# Patient Record
Sex: Female | Born: 1990 | Race: White | Hispanic: No | Marital: Single | Smoking: Current every day smoker
Health system: Southern US, Community
[De-identification: ages and names within clinical notes are randomized; demographics above are authoritative.]

## PROBLEM LIST (undated history)

## (undated) DIAGNOSIS — Z950 Presence of cardiac pacemaker: Secondary | ICD-10-CM

## (undated) DIAGNOSIS — I495 Sick sinus syndrome: Secondary | ICD-10-CM

## (undated) DIAGNOSIS — R001 Bradycardia, unspecified: Secondary | ICD-10-CM

---

## 2017-11-09 ENCOUNTER — Emergency Department (HOSPITAL_COMMUNITY): Payer: Medicaid Other

## 2017-11-09 ENCOUNTER — Encounter (HOSPITAL_COMMUNITY): Payer: Self-pay | Admitting: Emergency Medicine

## 2017-11-09 ENCOUNTER — Emergency Department (HOSPITAL_COMMUNITY)
Admission: EM | Admit: 2017-11-09 | Discharge: 2017-11-10 | Disposition: A | Discharge status: Home / Self Care | Payer: Medicaid Other | Attending: Emergency Medicine | Admitting: Emergency Medicine

## 2017-11-09 DIAGNOSIS — Z20818 Contact with and (suspected) exposure to other bacterial communicable diseases: Secondary | ICD-10-CM | POA: Insufficient documentation

## 2017-11-09 DIAGNOSIS — F17210 Nicotine dependence, cigarettes, uncomplicated: Secondary | ICD-10-CM | POA: Insufficient documentation

## 2017-11-09 DIAGNOSIS — R0602 Shortness of breath: Secondary | ICD-10-CM | POA: Insufficient documentation

## 2017-11-09 DIAGNOSIS — Z950 Presence of cardiac pacemaker: Secondary | ICD-10-CM | POA: Insufficient documentation

## 2017-11-09 DIAGNOSIS — R002 Palpitations: Secondary | ICD-10-CM | POA: Insufficient documentation

## 2017-11-09 DIAGNOSIS — L03114 Cellulitis of left upper limb: Secondary | ICD-10-CM | POA: Insufficient documentation

## 2017-11-09 DIAGNOSIS — R0789 Other chest pain: Secondary | ICD-10-CM | POA: Insufficient documentation

## 2017-11-09 HISTORY — DX: Presence of cardiac pacemaker: Z95.0

## 2017-11-09 HISTORY — DX: Sick sinus syndrome: I49.5

## 2017-11-09 HISTORY — DX: Bradycardia, unspecified: R00.1

## 2017-11-09 LAB — BASIC METABOLIC PANEL
ANION GAP: 9 (ref 5–15)
BUN: 12 mg/dL (ref 6–20)
CO2: 24 mmol/L (ref 22–32)
Calcium: 8.9 mg/dL (ref 8.9–10.3)
Chloride: 105 mmol/L (ref 101–111)
Creatinine, Ser: 0.48 mg/dL (ref 0.44–1.00)
GFR calc Af Amer: >60 mL/min (ref >60)
GFR calc non Af Amer: >60 mL/min (ref >60)
GLUCOSE: 110 mg/dL — AB (ref 65–99)
POTASSIUM: 3.7 mmol/L (ref 3.5–5.1)
SODIUM: 138 mmol/L (ref 135–145)

## 2017-11-09 LAB — CBC
HEMATOCRIT: 30.9 % — AB (ref 36.0–46.0)
HEMOGLOBIN: 9.9 g/dL — AB (ref 12.0–15.0)
MCH: 26.2 pg (ref 26.0–34.0)
MCHC: 32 g/dL (ref 30.0–36.0)
MCV: 81.7 fL (ref 78.0–100.0)
Platelets: 156 10*3/uL (ref 150–400)
RBC: 3.78 MIL/uL — ABNORMAL LOW (ref 3.87–5.11)
RDW: 14.8 % (ref 11.5–15.5)
WBC: 7.2 10*3/uL (ref 4.0–10.5)

## 2017-11-09 LAB — I-STAT TROPONIN, ED: Troponin i, poc: 0.01 ng/mL (ref 0.00–0.08)

## 2017-11-09 LAB — I-STAT BETA HCG BLOOD, ED (MC, WL, AP ONLY)

## 2017-11-09 LAB — PROTIME-INR
INR: 1.22
PROTHROMBIN TIME: 15.3 s — AB (ref 11.4–15.2)

## 2017-11-09 NOTE — ED Triage Notes (Signed)
Patient reports left lower chest pain with nausea , denies SOB or diaphoresis , her cardiologist is Dr. Sula Rumpleruker , history of sick sinus syndrome/bradycardia with pacemaker .

## 2017-11-09 NOTE — ED Notes (Signed)
Pt rounded on by RN. Pt ambulatory and trying to get drink from machine.

## 2017-11-10 LAB — MAGNESIUM: MAGNESIUM: 2.3 mg/dL (ref 1.7–2.4)

## 2017-11-10 LAB — I-STAT TROPONIN, ED: Troponin i, poc: 0 ng/mL (ref 0.00–0.08)

## 2017-11-10 MED ORDER — SULFAMETHOXAZOLE-TRIMETHOPRIM 800-160 MG PO TABS
1.0000 | ORAL_TABLET | Freq: Once | ORAL | Status: AC
  Administered 2017-11-10: 1 via ORAL
  Filled 2017-11-10: qty 1

## 2017-11-10 MED ORDER — CEPHALEXIN 500 MG PO CAPS: 500.0000 mg | ORAL_CAPSULE | Freq: Three times a day (TID) | ORAL | 0 refills | Status: AC

## 2017-11-10 MED ORDER — SODIUM CHLORIDE 0.9 % IV BOLUS
500.0000 mL | Freq: Once | INTRAVENOUS | Status: AC
  Administered 2017-11-10: 500 mL via INTRAVENOUS

## 2017-11-10 MED ORDER — SULFAMETHOXAZOLE-TRIMETHOPRIM 800-160 MG PO TABS: 1.0000 | ORAL_TABLET | Freq: Two times a day (BID) | ORAL | 0 refills | Status: AC

## 2017-11-10 NOTE — Discharge Instructions (Addendum)
Start taking the antibiotics as prescribed. The BACTRIM will cover MRSA or staph, but the Keflex will help cover other organisms. I'd recommend starting both, but if you would like to take one medication only, stick with the Bactrim.  For your chest pain, I suspect this is from dehydration and your underlying heart rhythm problems. The interrogation of your pacemaker was reassuring today, but did show the episodes of VTach that you discussed with your Cardiologist.

## 2017-11-10 NOTE — ED Notes (Signed)
Pt sees Dr. Mayme Gentarucker in [place]. Pt's pacemaker interrogated.

## 2017-11-10 NOTE — ED Provider Notes (Signed)
[facility] EMERGENCY DEPARTMENT Provider Note   CSN: [id number] Arrival date & time: 11/09/17  2036     History   Chief Complaint Chief Complaint  Patient presents with  . Chest Pain    HPI Kelsey Hardy is a 27 y.o. female.  HPI   27 year old female with past medical history as below including sick sinus syndrome status post pacemaker placement here with transient chest pain as well as left thumb redness.  Regarding her thumb redness, the patient states that approximately 3 days ago, she began to develop an aching, throbbing pain in her left dorsum of the hand overlying her thumb.  She states she uses her hands frequently at work.  She is currently been helping to take care of her significant other, who has a staph infection.  She began developing redness in this area over the last 24 hours.  She states that possibly due to the anxiety associated with this, while she was with her boyfriend today she began to develop a mild chest pressure and shortness of breath.  These symptoms were associated with mild palpitations.  She has a history of similar symptoms in the past and has had a pacemaker but has no known coronary disease.  Symptoms are now completely resolved.  No lower extremity swelling.  No history of DVT or PE.  She is not on blood thinners.  She does note that she was recently seen by her cardiologist on Friday for tachycardia and was told she had several episodes of ventricular tachycardia.  She is currently being worked up for this as an outpatient.  No other medication changes.  Past Medical History:  Diagnosis Date  . Bradycardia   . Pacemaker   . Sick sinus syndrome (HCC)     There are no active problems to display for this patient.   Past Surgical History:  Procedure Laterality Date  . CESAREAN SECTION       OB History   None      Home Medications    Prior to Admission medications   Medication Sig Start Date End Date Taking? Authorizing  Provider  cephALEXin (KEFLEX) 500 MG capsule Take 1 capsule (500 mg total) by mouth 3 (three) times daily for 7 days. 11/10/17 11/17/17  Shaune Pollack, MD  sulfamethoxazole-trimethoprim (BACTRIM DS,SEPTRA DS) 800-160 MG tablet Take 1 tablet by mouth 2 (two) times daily for 7 days. 11/10/17 11/17/17  Shaune Pollack, MD    Family History No family history on file.  Social History Social History   Tobacco Use  . Smoking status: Current Every Day Smoker  . Smokeless tobacco: Never Used  Substance Use Topics  . Alcohol use: Never    Frequency: Never  . Drug use: Never     Allergies   Patient has no known allergies.   Review of Systems Review of Systems  Constitutional: Positive for fatigue. Negative for chills and fever.  HENT: Negative for congestion and rhinorrhea.   Eyes: Negative for visual disturbance.  Respiratory: Positive for chest tightness. Negative for cough, shortness of breath and wheezing.   Cardiovascular: Positive for chest pain. Negative for leg swelling.  Gastrointestinal: Negative for abdominal pain, diarrhea, nausea and vomiting.  Genitourinary: Negative for dysuria and flank pain.  Musculoskeletal: Negative for neck pain and neck stiffness.  Skin: Positive for rash. Negative for wound.  Allergic/Immunologic: Negative for immunocompromised state.  Neurological: Negative for syncope, weakness and headaches.  All other systems reviewed and are negative.  Physical Exam Updated Vital Signs BP 117/79   Pulse (!) 51   Temp 98.3 F (36.8 C) (Oral)   Resp (!) 24   Ht 5\' 1"  (1.549 m)   Wt 55.3 kg (122 lb)   LMP 11/06/2017 Comment: pt shielded  SpO2 96%   BMI 23.05 kg/m   Physical Exam  Constitutional: She is oriented to person, place, and time. She appears well-developed and well-nourished. No distress.  HENT:  Head: Normocephalic and atraumatic.  Eyes: Conjunctivae are normal.  Neck: Neck supple.  Cardiovascular: Normal rate, regular rhythm and  normal heart sounds. Exam reveals no friction rub.  No murmur heard. Pulmonary/Chest: Effort normal and breath sounds normal. No respiratory distress. She has no wheezes. She has no rales.  Abdominal: She exhibits no distension.  Musculoskeletal: She exhibits no edema.  Neurological: She is alert and oriented to person, place, and time. She exhibits normal muscle tone.  Skin: Skin is warm. Capillary refill takes less than 2 seconds.  Psychiatric: She has a normal mood and affect.  Nursing note and vitals reviewed.  UPPER EXTREMITY EXAM: LEFT  INSPECTION & PALPATION: Mild erythema overlying dorsum of left thumb, with slight streaking.  No fluctuance.  No drainage.  No open wounds.  No track marks or signs of skin trauma.  SENSORY: Sensation is intact to light touch in:  Superficial radial nerve distribution (dorsal first web space) Median nerve distribution (tip of index finger)   Ulnar nerve distribution (tip of small finger)     MOTOR:  + Motor posterior interosseous nerve (thumb IP extension) + Anterior interosseous nerve (thumb IP flexion, index finger DIP flexion) + Radial nerve (wrist extension) + Median nerve (palpable firing thenar mass) + Ulnar nerve (palpable firing of first dorsal interosseous muscle)  VASCULAR: 2+ radial pulse Brisk capillary refill < 2 sec, fingers warm and well-perfused   ED Treatments / Results  Labs (all labs ordered are listed, but only abnormal results are displayed) Labs Reviewed  BASIC METABOLIC PANEL - Abnormal; Notable for the following components:      Result Value   Glucose, Bld 110 (*)    All other components within normal limits  CBC - Abnormal; Notable for the following components:   RBC 3.78 (*)    Hemoglobin 9.9 (*)    HCT 30.9 (*)    All other components within normal limits  PROTIME-INR - Abnormal; Notable for the following components:   Prothrombin Time 15.3 (*)    All other components within normal limits  MAGNESIUM    I-STAT TROPONIN, ED  I-STAT BETA HCG BLOOD, ED ([facility], [facility], [facility] ONLY)  I-STAT TROPONIN, ED    EKG EKG Interpretation  Date/Time:  Friday November 09 2017 20:41:02 EDT Ventricular Rate:  73 PR Interval:  248 QRS Duration: 82 QT Interval:  394 QTC Calculation: 434 R Axis:   57 Text Interpretation:  Sinus rhythm with sinus arrhythmia with 1st degree A-V block with frequent Premature ventricular complexes Abnormal ECG No old tracing to compare Confirmed by Shaune Pollack [phone]) on 11/10/2017 5:14:21 AM   Radiology Dg Chest 2 View  Result Date: 11/09/2017 CLINICAL DATA:  Left lower chest pain with nausea. History of sick sinus syndrome/bradycardia with pacemaker. EXAM: CHEST - 2 VIEW COMPARISON:  None. FINDINGS: Moderate cardiomegaly. There is a single lead left subclavian pacemaker projecting to the right ventricular apex. There is mild linear scarring or atelectasis at the right lung base. The lungs are otherwise clear. There is no pleural effusion  or pneumothorax. No acute osseous findings are seen. IMPRESSION: Cardiomegaly without acute cardiopulmonary process. Pacemaker appears well positioned. Electronically Signed   By: Carey BullocksWilliam  Veazey M.D.   On: 11/09/2017 21:12    Procedures Procedures (including critical care time)  Medications Ordered in ED Medications  sodium chloride 0.9 % bolus 500 mL (0 mLs Intravenous Stopped 11/10/17 0536)  sulfamethoxazole-trimethoprim (BACTRIM DS,SEPTRA DS) 800-160 MG per tablet 1 tablet (1 tablet Oral Given 11/10/17 0435)     Initial Impression / Assessment and Plan / ED Course  I have reviewed the triage vital signs and the nursing notes.  Pertinent labs & imaging results that were available during my care of the patient were reviewed by me and considered in my medical decision making (see chart for details).     27 yo F here with multiple complaints.  Thumb pain/redness: Exam is consistent with superficial cellulitis.  No evidence of abscess.  No  extension to the flexor surface or signs of tenosynovitis.  She is afebrile and hemodynamically stable.  No evidence of sepsis.  Will give her Bactrim given her history of recent MRSA exposure, as well as Keflex and DC home.  Regarding her chest pain, her pacemaker was interrogated.  She did have some briefly resolved episodes of V. tach previously, but last episode was on 6/5, after which she was seen by her cardiologist.  Her lytes are acceptable.  Troponin negative x2.  She has no hypoxia, tachypnea, shortness of breath currently, tachycardia, or signs to suggest DVT or PE and she is PERC negative.  Will refer her back to her cardiologist with good return precautions.  Final Clinical Impressions(s) / ED Diagnoses   Final diagnoses:  Atypical chest pain  Cellulitis of left upper extremity    ED Discharge Orders        Ordered    cephALEXin (KEFLEX) 500 MG capsule  3 times daily     11/10/17 0633    sulfamethoxazole-trimethoprim (BACTRIM DS,SEPTRA DS) 800-160 MG tablet  2 times daily     11/10/17 [id number]       Shaune PollackIsaacs, Damari Suastegui, MD 11/10/17 [phone]

## 2017-11-15 ENCOUNTER — Other Ambulatory Visit: Payer: Self-pay

## 2017-11-15 ENCOUNTER — Emergency Department (HOSPITAL_COMMUNITY): Payer: Medicaid Other

## 2017-11-15 ENCOUNTER — Encounter (HOSPITAL_COMMUNITY): Payer: Self-pay

## 2017-11-15 ENCOUNTER — Emergency Department (HOSPITAL_COMMUNITY)
Admission: EM | Admit: 2017-11-15 | Discharge: 2017-11-15 | Disposition: A | Discharge status: Home / Self Care | Payer: Medicaid Other | Attending: Emergency Medicine | Admitting: Emergency Medicine

## 2017-11-15 DIAGNOSIS — Z950 Presence of cardiac pacemaker: Secondary | ICD-10-CM | POA: Diagnosis not present

## 2017-11-15 DIAGNOSIS — Z79899 Other long term (current) drug therapy: Secondary | ICD-10-CM | POA: Insufficient documentation

## 2017-11-15 DIAGNOSIS — R110 Nausea: Secondary | ICD-10-CM

## 2017-11-15 DIAGNOSIS — F17200 Nicotine dependence, unspecified, uncomplicated: Secondary | ICD-10-CM | POA: Insufficient documentation

## 2017-11-15 DIAGNOSIS — R1011 Right upper quadrant pain: Secondary | ICD-10-CM

## 2017-11-15 DIAGNOSIS — R197 Diarrhea, unspecified: Secondary | ICD-10-CM | POA: Insufficient documentation

## 2017-11-15 DIAGNOSIS — N3000 Acute cystitis without hematuria: Secondary | ICD-10-CM | POA: Diagnosis not present

## 2017-11-15 LAB — COMPREHENSIVE METABOLIC PANEL
ALBUMIN: 3.3 g/dL — AB (ref 3.5–5.0)
ALT: 48 U/L (ref 14–54)
ANION GAP: 7 (ref 5–15)
AST: 48 U/L — ABNORMAL HIGH (ref 15–41)
Alkaline Phosphatase: 140 U/L — ABNORMAL HIGH (ref 38–126)
BILIRUBIN TOTAL: 0.6 mg/dL (ref 0.3–1.2)
BUN: 12 mg/dL (ref 6–20)
CHLORIDE: 111 mmol/L (ref 101–111)
CO2: 26 mmol/L (ref 22–32)
Calcium: 9 mg/dL (ref 8.9–10.3)
Creatinine, Ser: 0.61 mg/dL (ref 0.44–1.00)
GFR calc Af Amer: >60 mL/min (ref >60)
GLUCOSE: 113 mg/dL — AB (ref 65–99)
POTASSIUM: 3.7 mmol/L (ref 3.5–5.1)
Sodium: 144 mmol/L (ref 135–145)
TOTAL PROTEIN: 6.8 g/dL (ref 6.5–8.1)

## 2017-11-15 LAB — URINALYSIS, ROUTINE W REFLEX MICROSCOPIC
BILIRUBIN URINE: NEGATIVE
Glucose, UA: 50 mg/dL — AB
HGB URINE DIPSTICK: NEGATIVE
Ketones, ur: NEGATIVE mg/dL
LEUKOCYTES UA: NEGATIVE
NITRITE: NEGATIVE
Specific Gravity, Urine: 1.011 (ref 1.005–1.030)
pH: 9 — ABNORMAL HIGH (ref 5.0–8.0)

## 2017-11-15 LAB — CBC WITH DIFFERENTIAL/PLATELET
BASOS ABS: 0 10*3/uL (ref 0.0–0.1)
BASOS PCT: 0 %
Eosinophils Absolute: 0.2 10*3/uL (ref 0.0–0.7)
Eosinophils Relative: 2 %
HEMATOCRIT: 31.9 % — AB (ref 36.0–46.0)
HEMOGLOBIN: 10.5 g/dL — AB (ref 12.0–15.0)
Lymphocytes Relative: 12 %
Lymphs Abs: 1.1 10*3/uL (ref 0.7–4.0)
MCH: 26.9 pg (ref 26.0–34.0)
MCHC: 32.9 g/dL (ref 30.0–36.0)
MCV: 81.6 fL (ref 78.0–100.0)
Monocytes Absolute: 0.8 10*3/uL (ref 0.1–1.0)
Monocytes Relative: 8 %
NEUTROS ABS: 7.2 10*3/uL (ref 1.7–7.7)
NEUTROS PCT: 78 %
Platelets: 214 10*3/uL (ref 150–400)
RBC: 3.91 MIL/uL (ref 3.87–5.11)
RDW: 15.3 % (ref 11.5–15.5)
WBC: 9.2 10*3/uL (ref 4.0–10.5)

## 2017-11-15 LAB — RAPID URINE DRUG SCREEN, HOSP PERFORMED
Amphetamines: NOT DETECTED
Barbiturates: NOT DETECTED
Benzodiazepines: NOT DETECTED
Cocaine: NOT DETECTED
OPIATES: POSITIVE — AB
TETRAHYDROCANNABINOL: NOT DETECTED

## 2017-11-15 LAB — PREGNANCY, URINE: Preg Test, Ur: NEGATIVE

## 2017-11-15 LAB — LIPASE, BLOOD: LIPASE: 46 U/L (ref 11–51)

## 2017-11-15 MED ORDER — AZITHROMYCIN 250 MG PO TABS: 250.0000 mg | ORAL_TABLET | Freq: Every day | ORAL | 0 refills | Status: AC

## 2017-11-15 MED ORDER — ONDANSETRON HCL 4 MG/2ML IJ SOLN
4.0000 mg | Freq: Once | INTRAMUSCULAR | Status: AC
  Administered 2017-11-15: 4 mg via INTRAVENOUS
  Filled 2017-11-15: qty 2

## 2017-11-15 MED ORDER — ONDANSETRON HCL 4 MG/2ML IJ SOLN
4.0000 mg | Freq: Once | INTRAMUSCULAR | Status: AC
  Administered 2017-11-15: 4 mg via INTRAVENOUS

## 2017-11-15 MED ORDER — IOPAMIDOL (ISOVUE-300) INJECTION 61%
100.0000 mL | Freq: Once | INTRAVENOUS | Status: AC | PRN
  Administered 2017-11-15: 100 mL via INTRAVENOUS

## 2017-11-15 MED ORDER — SODIUM CHLORIDE 0.9 % IV BOLUS
500.0000 mL | Freq: Once | INTRAVENOUS | Status: AC
  Administered 2017-11-15: 500 mL via INTRAVENOUS

## 2017-11-15 MED ORDER — PROMETHAZINE HCL 25 MG PO TABS: 25.0000 mg | ORAL_TABLET | Freq: Four times a day (QID) | ORAL | 0 refills | Status: AC | PRN

## 2017-11-15 MED ORDER — TECHNETIUM TC 99M MEBROFENIN IV KIT
5.0000 | PACK | Freq: Once | INTRAVENOUS | Status: AC | PRN
  Administered 2017-11-15: 5.17 via INTRAVENOUS

## 2017-11-15 MED ORDER — SODIUM CHLORIDE 0.9 % IV SOLN
500.0000 mg | Freq: Once | INTRAVENOUS | Status: AC
  Administered 2017-11-15: 500 mg via INTRAVENOUS
  Filled 2017-11-15: qty 500

## 2017-11-15 MED ORDER — AMOXICILLIN 500 MG PO CAPS: 500.0000 mg | ORAL_CAPSULE | Freq: Three times a day (TID) | ORAL | 0 refills | Status: AC

## 2017-11-15 MED ORDER — SODIUM CHLORIDE 0.9 % IV SOLN
1.0000 g | Freq: Once | INTRAVENOUS | Status: AC
  Administered 2017-11-15: 1 g via INTRAVENOUS

## 2017-11-15 MED ORDER — ONDANSETRON HCL 4 MG/2ML IJ SOLN
INTRAMUSCULAR | Status: AC
  Filled 2017-11-15: qty 2

## 2017-11-15 MED ORDER — IOPAMIDOL (ISOVUE-300) INJECTION 61%
30.0000 mL | Freq: Once | INTRAVENOUS | Status: AC | PRN
  Administered 2017-11-15: 30 mL via ORAL

## 2017-11-15 MED ORDER — KETOROLAC TROMETHAMINE 30 MG/ML IJ SOLN
30.0000 mg | Freq: Once | INTRAMUSCULAR | Status: AC
  Administered 2017-11-15: 30 mg via INTRAVENOUS
  Filled 2017-11-15: qty 1

## 2017-11-15 MED ORDER — SODIUM CHLORIDE 0.9 % IV SOLN
1.0000 g | Freq: Once | INTRAVENOUS | Status: AC
  Administered 2017-11-15: 1 g via INTRAVENOUS
  Filled 2017-11-15: qty 10

## 2017-11-15 MED ORDER — SODIUM CHLORIDE 0.9 % IV BOLUS
1000.0000 mL | Freq: Once | INTRAVENOUS | Status: AC
Start: 2017-11-15 — End: 2017-11-15
  Administered 2017-11-15: 1000 mL via INTRAVENOUS

## 2017-11-15 MED ORDER — CEFTRIAXONE SODIUM 1 G IJ SOLR
INTRAMUSCULAR | Status: AC
  Filled 2017-11-15: qty 10

## 2017-11-15 MED ORDER — LORAZEPAM 2 MG/ML IJ SOLN
1.0000 mg | Freq: Once | INTRAMUSCULAR | Status: AC
Start: 2017-11-15 — End: 2017-11-15
  Administered 2017-11-15: 1 mg via INTRAVENOUS
  Filled 2017-11-15: qty 1

## 2017-11-15 NOTE — ED Notes (Signed)
NPO since 11/14/2017 at 1800.  No opiates taken.

## 2017-11-15 NOTE — Consult Note (Signed)
[facility] Consult  Reason for Consult:?: Acalculous Cholecystitis  Referring Physician: Dr. Wilson Singer  Chief Complaint    Abdominal Pain      Kelsey Hardy is a 27 y.o. female.  HPI: Kelsey Hardy is a 27 yo with heroin abuse, active, on suboxone too, who recently was treated for bacteremia in 05/2017 at [facility], and found to have bradycardia, sick sinus syndrome, and underwent pacemaker placement 07/2017. She came to the Ed with RUQ pain, and was found to have severe liver congestion and some fluid around the gallbladder on CT scan. She underwent a [place] that also demonstrated no stones and hepatic congestion and pericholecystic fluid.    She had been having the pain since the evening prior, and was also having some withdrawal symptoms per ED documentation.  She had some nausea but no vomiting.   She had some cough and her symptoms of dizziness prior to her pacemaker were better.  She denies any endocarditis or heart infection, but did say she only had 1 pacemaker lead due to scarring of her heart. Her heart was very enlarged on the CT scan.   Past Medical History:  Diagnosis Date  . Bradycardia   . Pacemaker   . Sick sinus syndrome [facility])     Past Surgical History:  Procedure Laterality Date  . CESAREAN SECTION      History reviewed. No pertinent family history.  Social History   Tobacco Use  . Smoking status: Current Every Day Smoker  . Smokeless tobacco: Never Used  Substance Use Topics  . Alcohol use: Never    Frequency: Never  . Drug use: Never    Medications: I have reviewed the patient's current medications. No current facility-administered medications for this encounter.    Current Outpatient Medications  Medication Sig Dispense Refill Last Dose  . gabapentin (NEURONTIN) 300 MG capsule Take 4 capsules by mouth at bedtime.   11/14/2017 at Unknown time  . QUEtiapine (SEROQUEL) 25 MG tablet Take 1 tablet by mouth 4 (four) times daily.  5 11/14/2017 at  Unknown time  . sertraline (ZOLOFT) 100 MG tablet Take 1 tablet by mouth at bedtime.  5 11/14/2017 at Unknown time  . SUBOXONE 8-2 MG FILM Take 1 Film by mouth 3 (three) times daily.  0 11/14/2017 at Unknown time  . traZODone (DESYREL) 50 MG tablet Take 1 tablet by mouth at bedtime.   11/14/2017 at Unknown time  . amoxicillin (AMOXIL) 500 MG capsule Take 1 capsule (500 mg total) by mouth 3 (three) times daily. 21 capsule 0   . azithromycin (ZITHROMAX) 250 MG tablet Take 1 tablet (250 mg total) by mouth daily. Had first dose in ER. 1 tab daily starting 11/16/17. 4 tablet 0   . cephALEXin (KEFLEX) 500 MG capsule Take 1 capsule (500 mg total) by mouth 3 (three) times daily for 7 days. 21 capsule 0   . promethazine (PHENERGAN) 25 MG tablet Take 1 tablet (25 mg total) by mouth every 6 (six) hours as needed for nausea or vomiting. 15 tablet 0   . sulfamethoxazole-trimethoprim (BACTRIM DS,SEPTRA DS) 800-160 MG tablet Take 1 tablet by mouth 2 (two) times daily for 7 days. 14 tablet 0    No Known Allergies   ROS:  A comprehensive review of systems was negative except for: Constitutional: positive for shaking, ? withdrawal symptoms Cardiovascular: positive for bradycardia, pacemaker Gastrointestinal: positive for abdominal pain, diarrhea and nausea  Blood pressure 125/81, pulse (!) 51, temperature 98.3 F (36.8 C), temperature  source Oral, resp. rate 18, height _0  (1.549 m), weight 120 lb (54.4 kg), last menstrual period 11/06/2017, SpO2 94 %. Physical Exam  Constitutional: She is oriented to person, place, and time. She appears well-developed.  Non-toxic appearance.  HENT:  Head: Normocephalic.  Eyes: Pupils are equal, round, and reactive to light.  Dilated pupils  Cardiovascular: Normal rate and regular rhythm.  Pulmonary/Chest: Effort normal.  Abdominal: Normal appearance. There is hepatomegaly. There is tenderness in the right upper quadrant and epigastric area. There is no rebound and no  guarding.  Musculoskeletal: Normal range of motion.  No lower edema  Neurological: She is alert and oriented to person, place, and time.  Skin: Skin is warm and dry.  Psychiatric: She has a normal mood and affect. Her behavior is normal.  Vitals reviewed.   Results: Results for orders placed or performed during the hospital encounter of 11/15/17 (from the past 48 hour(s))  Urinalysis, Routine w reflex microscopic     Status: Abnormal   Collection Time: 11/15/17  1:18 AM  Result Value Ref Range   Color, Urine YELLOW YELLOW   APPearance CLOUDY (A) CLEAR   Specific Gravity, Urine 1.011 1.005 - 1.030   pH 9.0 (H) 5.0 - 8.0   Glucose, UA 50 (A) NEGATIVE mg/dL   Hgb urine dipstick NEGATIVE NEGATIVE   Bilirubin Urine NEGATIVE NEGATIVE   Ketones, ur NEGATIVE NEGATIVE mg/dL   Protein, ur >=300 (A) NEGATIVE mg/dL   Nitrite NEGATIVE NEGATIVE   Leukocytes, UA NEGATIVE NEGATIVE   RBC / HPF 11-20 0 - 5 RBC/hpf   WBC, UA 21-50 0 - 5 WBC/hpf   Bacteria, UA RARE (A) NONE SEEN   Squamous Epithelial / LPF 6-10 0 - 5   Mucus PRESENT     Comment: Performed at [facility], [address], [place], [place] [address]  Urine rapid drug screen (hosp performed)     Status: Abnormal   Collection Time: 11/15/17  1:18 AM  Result Value Ref Range   Opiates POSITIVE (A) NONE DETECTED   Cocaine NONE DETECTED NONE DETECTED   Benzodiazepines NONE DETECTED NONE DETECTED   Amphetamines NONE DETECTED NONE DETECTED   Tetrahydrocannabinol NONE DETECTED NONE DETECTED   Barbiturates NONE DETECTED NONE DETECTED    Comment: (NOTE) DRUG SCREEN FOR MEDICAL PURPOSES ONLY.  IF CONFIRMATION IS NEEDED FOR ANY PURPOSE, NOTIFY LAB WITHIN 5 DAYS. LOWEST DETECTABLE LIMITS FOR URINE DRUG SCREEN Drug Class                     Cutoff (ng/mL) Amphetamine and metabolites    1000 Barbiturate and metabolites    200 Benzodiazepine                 998 Tricyclics and metabolites     300 Opiates and metabolites         300 Cocaine and metabolites        300 THC                            50 Performed at [facility] [place], [place] [address]   Comprehensive metabolic panel     Status: Abnormal   Collection Time: 11/15/17  1:32 AM  Result Value Ref Range   Sodium 144 135 - 145 mmol/L   Potassium 3.7 3.5 - 5.1 mmol/L   Chloride 111 101 - 111 mmol/L   CO2 26 22 - 32 mmol/L  Glucose, Bld 113 (H) 65 - 99 mg/dL   BUN 12 6 - 20 mg/dL   Creatinine, Ser 0.61 0.44 - 1.00 mg/dL   Calcium 9.0 8.9 - 10.3 mg/dL   Total Protein 6.8 6.5 - 8.1 g/dL   Albumin 3.3 (L) 3.5 - 5.0 g/dL   AST 48 (H) 15 - 41 U/L   ALT 48 14 - 54 U/L   Alkaline Phosphatase 140 (H) 38 - 126 U/L   Total Bilirubin 0.6 0.3 - 1.2 mg/dL   GFR calc non Af Amer >60 >60 mL/min   GFR calc Af Amer >60 >60 mL/min    Comment: (NOTE) The eGFR has been calculated using the CKD EPI equation. This calculation has not been validated in all clinical situations. eGFR's persistently <60 mL/min signify possible Chronic Kidney Disease.    Anion gap 7 5 - 15    Comment: Performed at [facility], [address] [place], [place] [address]  Lipase, blood     Status: None   Collection Time: 11/15/17  1:32 AM  Result Value Ref Range   Lipase 46 11 - 51 U/L    Comment: Performed at [facility], [address] [place], [place] [address]  CBC with Differential     Status: Abnormal   Collection Time: 11/15/17  1:32 AM  Result Value Ref Range   WBC 9.2 4.0 - 10.5 K/uL   RBC 3.91 3.87 - 5.11 MIL/uL   Hemoglobin 10.5 (L) 12.0 - 15.0 g/dL   HCT 31.9 (L) 36.0 - 46.0 %   MCV 81.6 78.0 - 100.0 fL   MCH 26.9 26.0 - 34.0 pg   MCHC 32.9 30.0 - 36.0 g/dL   RDW 15.3 11.5 - 15.5 %   Platelets 214 150 - 400 K/uL   Neutrophils Relative % 78 %   Neutro Abs 7.2 1.7 - 7.7 K/uL   Lymphocytes Relative 12 %   Lymphs Abs 1.1 0.7 - 4.0 K/uL   Monocytes Relative 8 %   Monocytes Absolute 0.8 0.1 - 1.0 K/uL   Eosinophils Relative 2 %   Eosinophils  Absolute 0.2 0.0 - 0.7 K/uL   Basophils Relative 0 %   Basophils Absolute 0.0 0.0 - 0.1 K/uL    Comment: Performed at [facility], [address] [place], [place] [address]  Pregnancy, urine     Status: None   Collection Time: 11/15/17  1:32 AM  Result Value Ref Range   Preg Test, Ur NEGATIVE NEGATIVE    Comment:        THE SENSITIVITY OF THIS METHODOLOGY IS >20 mIU/mL. Performed at [facility], [address], [place], [place] [address]    Personally reviewed- HIDA negative and CT with congestion of the liver and around the gallbladder, no signs of stones Dg Chest 2 View  Result Date: 11/15/2017 CLINICAL DATA:  Cough EXAM: CHEST - 2 VIEW COMPARISON:  11/09/2017 FINDINGS: Left pacer remains in place, unchanged. Cardiomegaly. Patchy bilateral lower lobe airspace opacities. No effusions or pneumothorax. No acute bony abnormality. IMPRESSION: Stable cardiomegaly. Patchy bilateral lower lobe airspace opacities concerning for pneumonia. Electronically Signed   By: Rolm Baptise M.D.   On: 11/15/2017 07:37   Ct Abdomen Pelvis W Contrast  Result Date: 11/15/2017 CLINICAL DATA:  Abdominal pain. Right upper quadrant tenderness. UTI. Heroin abuse. EXAM: CT ABDOMEN AND PELVIS WITH CONTRAST TECHNIQUE: Multidetector CT imaging of the abdomen and pelvis was performed using the standard protocol following bolus administration of intravenous contrast. CONTRAST:  150m ISOVUE-300 IOPAMIDOL (ISOVUE-300) INJECTION 61%, 366mISOVUE-300 IOPAMIDOL (ISOVUE-300) INJECTION 61% COMPARISON:  Chest radiograph 11/09/2017 FINDINGS: Lower chest: Multi chamber cardiomegaly with primary right heart dilatation. Pacemaker partially included. Small bilateral pleural effusions and adjacent atelectasis. Hepatobiliary: The liver is enlarged with diffusely decreased hepatic density. Focal fatty sparing adjacent the gallbladder fossa. Mild periportal edema. Gallbladder is physiologically distended with diffuse gallbladder wall  thickening in small amount of pericholecystic fluid. No calcified stone. Pancreas: No ductal dilatation or inflammation. Spleen: Enlarged spanning 16.7 cm cranial caudal. Normal heterogeneity for phase of enhancement. Adrenals/Urinary Tract: Normal adrenal glands. No hydronephrosis or perinephric edema. Homogeneous renal enhancement. Small subcentimeter simple cysts in the left upper kidney. Urinary bladder is physiologically distended without wall thickening. No definite perivesicular stranding. Stomach/Bowel: Stomach distended with ingested contents and enteric contrast no small bowel dilatation, obstruction or inflammation. Normal appendix. Moderate stool in the proximal colon. Descending and sigmoid colon are minimally distended with equivocal wall thickening versus nondistention. Vascular/Lymphatic: Distended supra hepatic IVC. Normal caliber abdominal aorta. Prominent periuterine and adnexal vascularity. No enlarged abdominal or pelvic lymph nodes. Reproductive: Prominent periuterine and adnexal vascularity, left greater than right. Small cysts or follicles in the left ovary. Right ovary appears normal. Other: Small volume of simple free fluid in the abdomen and pelvis. No free air. No loculated abscess. Musculoskeletal: There are no acute or suspicious osseous abnormalities. IMPRESSION: 1. Diffuse gallbladder wall thickening is likely multifactorial but felt to be secondary to passive hepatic congestion or chronic liver disease. 2. Hepatosplenomegaly and hepatic steatosis. Suspect passive hepatic congestion with prominent right heart dilatation. 3. Left colonic wall thickening versus nondistention. 4. Prominent adnexal and periuterine vascularity as can be seen with pelvic congestion. 5. Cardiomegaly with predominant right heart dilatation. Small bilateral pleural effusions and small amount of ascites in the abdomen and pelvis. Electronically Signed   By: MeJeb Levering.D.   On: 11/15/2017 06:21   Nm  Hepato W/eject Fract  Result Date: 11/15/2017 CLINICAL DATA:  Chronic right upper quadrant pain for the past few months. EXAM: NUCLEAR MEDICINE HEPATOBILIARY IMAGING WITH GALLBLADDER EF TECHNIQUE: Sequential images of the abdomen were obtained out to 60 minutes following intravenous administration of radiopharmaceutical. After oral ingestion of Ensure, gallbladder ejection fraction was determined. At 60 min, normal ejection fraction is greater than 33%. RADIOPHARMACEUTICALS:  5.17 mCi [id number] IV COMPARISON:  Right upper quadrant ultrasound from same day. FINDINGS: Prompt uptake and biliary excretion of activity by the liver is seen. Gallbladder activity is visualized, consistent with patency of cystic duct. Biliary activity passes into small bowel, consistent with patent common bile duct. Calculated gallbladder ejection fraction is 65%. (Normal gallbladder ejection fraction with Ensure is greater than 33%.) IMPRESSION: Normal hepatobiliary scan and gallbladder ejection fraction. Electronically Signed   By: WilTitus DubinD.   On: 11/15/2017 14:09   Us [place] Limited Ruq  Result Date: 11/15/2017 CLINICAL DATA:  RUQ pain, abn CT EXAM: ULTRASOUND ABDOMEN LIMITED RIGHT UPPER QUADRANT COMPARISON:  CT 11/15/2017. FINDINGS: Gallbladder: Gallbladder wall is thickened up 6.8 mm. No gallstones noted. Negative Murphy sign. Common bile duct: Diameter: 3.4 mm Liver: No focal lesion identified. Within normal limits in parenchymal echogenicity. Portal vein is patent on color Doppler imaging with normal direction of blood flow towards the liver. IMPRESSION: Thickening of the gallbladder wall up to 6.8 mm. This may be from hypoproteinemia. Acalculous cholecystitis cannot be excluded. No gallstones identified. No biliary distention. Electronically Signed   By: ThoMarcello Mooresegister  On: 11/15/2017 07:58     Assessment & Plan:  Kelsey Hardy is a 27 y.o. female with hepatic congestion and fluid around the  gallbladder, pain likely from capsular pain of the liver secondary to some degree of heart failure.  Patient with prior bradycardia with pacemaker, and no signs of infection. HIDA negative. This is not her gallbladder and is more consistent with her cardiac issues.   -Needs additional workup for cardiac issues, ECHO, etc, Dr. Georg Ruddle is her cardiologist at [facility], Dr. Wilson Singer reported to me he was going to call them  -No surgical intervention indicated   All questions were answered to the satisfaction of the patient.   Virl Cagey 11/15/2017, 4:20 PM

## 2017-11-15 NOTE — ED Triage Notes (Signed)
abd pain with diarrhea onset today.  Pt also c/o "arms hurting and jumping" and also cough.

## 2017-11-15 NOTE — ED Notes (Signed)
Patient actively vomiting. EDP made aware-verbal order given.

## 2017-11-15 NOTE — ED Provider Notes (Signed)
Assumed care at change of shift. Imaging with signs of cholecystitis. Unclear chronicity though. No stones. AP minimally elevated, otherwise LFTs ok.  Afebrile. No leukocytosis. She is complaining of upper abdominal pain and is maximally tender in RUQ though. Questionable pneumonia as well. When asked further she does endorse some dyspnea and cough for the past few days. Will discuss with general surgery.   HIDA ok. Likely chronic findings on imaging related to congestion. FU at [facility].    Leiana Rund, MD 11/18/17 1401

## 2017-11-15 NOTE — ED Provider Notes (Signed)
[facility] EMERGENCY DEPARTMENT Provider Note   CSN: [id number] Arrival date & time: 11/15/17  0018  Time seen 01:09 AM  History   Chief Complaint Chief Complaint  Patient presents with  . Abdominal Pain    HPI Kelsey Hardy is a 27 y.o. female.  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  She states she is currently on Suboxone.  She states she was doing heroin for about a week and stopped on June 12.  She states tonight about 6 PM she started having periumbilical abdominal pain.  She thought maybe she was having withdrawal because she was having some jerking of her arms and legs.  She states she took two Suboxone without relief.  She describes the pain is dull and achy.  She has had 3 episodes of watery diarrhea, nausea without vomiting.  She does not believe she has had fever.  She denies dysuria but states she had frequency and hematuria 2 to 3 weeks ago.  PCP Mayme Genta, Theotis Barrio, MD   Past Medical History:  Diagnosis Date  . Bradycardia   . Pacemaker   . Sick sinus syndrome (HCC)     There are no active problems to display for this patient.   Past Surgical History:  Procedure Laterality Date  . CESAREAN SECTION       OB History   None      Home Medications    Seroquel, Zoloft, gabapentin, trazodone, tramadol, suboxone    Prior to Admission medications   Medication Sig Start Date End Date Taking? Authorizing Provider  cephALEXin (KEFLEX) 500 MG capsule Take 1 capsule (500 mg total) by mouth 3 (three) times daily for 7 days. 11/10/17 11/17/17  Shaune Pollack, MD  sulfamethoxazole-trimethoprim (BACTRIM DS,SEPTRA DS) 800-160 MG tablet Take 1 tablet by mouth 2 (two) times daily for 7 days. 11/10/17 11/17/17  Shaune Pollack, MD    Family History No family history on file.  Social History Social History   Tobacco Use  . Smoking status: Current Every Day Smoker  . Smokeless tobacco: Never Used  Substance Use Topics  . Alcohol use: Never     Frequency: Never  . Drug use: Never  employed Uses heroin   Allergies   Patient has no known allergies.   Review of Systems Review of Systems  All other systems reviewed and are negative.    Physical Exam Updated Vital Signs BP 126/82 (BP Location: Left Arm)   Pulse 78   Temp 98.7 F (37.1 C) (Oral)   Resp 18   Ht 5\' 1"  (1.549 m)   Wt 54.4 kg (120 lb)   LMP 11/06/2017 Comment: pt shielded  SpO2 98%   BMI 22.67 kg/m   Vital signs normal    Physical Exam  Constitutional: She is oriented to person, place, and time. She appears well-developed and well-nourished.  Non-toxic appearance. She does not appear ill. No distress.  HENT:  Head: Normocephalic and atraumatic.  Right Ear: External ear normal.  Left Ear: External ear normal.  Nose: Nose normal. No mucosal edema or rhinorrhea.  Mouth/Throat: Mucous membranes are dry. No dental abscesses or uvula swelling.  Eyes: Pupils are equal, round, and reactive to light. Conjunctivae and EOM are normal.  Neck: Normal range of motion and full passive range of motion without pain. Neck supple.  Cardiovascular: Normal rate, regular rhythm and normal heart sounds. Exam reveals no gallop and no friction rub.  No murmur heard. Pulmonary/Chest: Effort normal  and breath sounds normal. No respiratory distress. She has no wheezes. She has no rhonchi. She has no rales. She exhibits no tenderness and no crepitus.  Abdominal: Soft. Normal appearance and bowel sounds are normal. She exhibits no distension. There is tenderness. There is no rebound and no guarding.    Patient was very tender in her right upper quadrant, a little less so in the epigastric and left upper quadrant.  She has mild tenderness in the right lower and left lower abdomen but not the suprapubic or umbilical areas.  Musculoskeletal: Normal range of motion. She exhibits no edema or tenderness.  Moves all extremities well.   Neurological: She is alert and oriented to  person, place, and time. She has normal strength. No cranial nerve deficit.  No jerking of her extremities was seen  Skin: Skin is warm, dry and intact. No rash noted. No erythema. No pallor.  Psychiatric: She has a normal mood and affect. Her speech is normal and behavior is normal. Her mood appears not anxious.  Nursing note and vitals reviewed.    ED Treatments / Results  Labs (all labs ordered are listed, but only abnormal results are displayed) Results for orders placed or performed during the hospital encounter of 11/15/17  Comprehensive metabolic panel  Result Value Ref Range   Sodium 144 135 - 145 mmol/L   Potassium 3.7 3.5 - 5.1 mmol/L   Chloride 111 101 - 111 mmol/L   CO2 26 22 - 32 mmol/L   Glucose, Bld 113 (H) 65 - 99 mg/dL   BUN 12 6 - 20 mg/dL   Creatinine, Ser 1.61 0.44 - 1.00 mg/dL   Calcium 9.0 8.9 - 09.6 mg/dL   Total Protein 6.8 6.5 - 8.1 g/dL   Albumin 3.3 (L) 3.5 - 5.0 g/dL   AST 48 (H) 15 - 41 U/L   ALT 48 14 - 54 U/L   Alkaline Phosphatase 140 (H) 38 - 126 U/L   Total Bilirubin 0.6 0.3 - 1.2 mg/dL   GFR calc non Af Amer >60 >60 mL/min   GFR calc Af Amer >60 >60 mL/min   Anion gap 7 5 - 15  Lipase, blood  Result Value Ref Range   Lipase 46 11 - 51 U/L  CBC with Differential  Result Value Ref Range   WBC 9.2 4.0 - 10.5 K/uL   RBC 3.91 3.87 - 5.11 MIL/uL   Hemoglobin 10.5 (L) 12.0 - 15.0 g/dL   HCT 04.5 (L) 40.9 - 81.1 %   MCV 81.6 78.0 - 100.0 fL   MCH 26.9 26.0 - 34.0 pg   MCHC 32.9 30.0 - 36.0 g/dL   RDW 91.4 78.2 - 95.6 %   Platelets 214 150 - 400 K/uL   Neutrophils Relative % 78 %   Neutro Abs 7.2 1.7 - 7.7 K/uL   Lymphocytes Relative 12 %   Lymphs Abs 1.1 0.7 - 4.0 K/uL   Monocytes Relative 8 %   Monocytes Absolute 0.8 0.1 - 1.0 K/uL   Eosinophils Relative 2 %   Eosinophils Absolute 0.2 0.0 - 0.7 K/uL   Basophils Relative 0 %   Basophils Absolute 0.0 0.0 - 0.1 K/uL  Urinalysis, Routine w reflex microscopic  Result Value Ref Range    Color, Urine YELLOW YELLOW   APPearance CLOUDY (A) CLEAR   Specific Gravity, Urine 1.011 1.005 - 1.030   pH 9.0 (H) 5.0 - 8.0   Glucose, UA 50 (A) NEGATIVE mg/dL   Hgb urine  dipstick NEGATIVE NEGATIVE   Bilirubin Urine NEGATIVE NEGATIVE   Ketones, ur NEGATIVE NEGATIVE mg/dL   Protein, ur >=161 (A) NEGATIVE mg/dL   Nitrite NEGATIVE NEGATIVE   Leukocytes, UA NEGATIVE NEGATIVE   RBC / HPF 11-20 0 - 5 RBC/hpf   WBC, UA 21-50 0 - 5 WBC/hpf   Bacteria, UA RARE (A) NONE SEEN   Squamous Epithelial / LPF 6-10 0 - 5   Mucus PRESENT   Urine rapid drug screen (hosp performed)  Result Value Ref Range   Opiates POSITIVE (A) NONE DETECTED   Cocaine NONE DETECTED NONE DETECTED   Benzodiazepines NONE DETECTED NONE DETECTED   Amphetamines NONE DETECTED NONE DETECTED   Tetrahydrocannabinol NONE DETECTED NONE DETECTED   Barbiturates NONE DETECTED NONE DETECTED  Pregnancy, urine  Result Value Ref Range   Preg Test, Ur NEGATIVE NEGATIVE    Laboratory interpretation all normal except possible UTI, elevation of LFT's mild, mild anemia   EKG None  Radiology Ct Abdomen Pelvis W Contrast  Result Date: 11/15/2017 CLINICAL DATA:  Abdominal pain. Right upper quadrant tenderness. UTI. Heroin abuse. EXAM: CT ABDOMEN AND PELVIS WITH CONTRAST TECHNIQUE: Multidetector CT imaging of the abdomen and pelvis was performed using the standard protocol following bolus administration of intravenous contrast. CONTRAST:  ISOVUE-300 IOPAMIDOL (ISOVUE-300) INJECTION 61%, 30mL ISOVUE-300 IOPAMIDOL (ISOVUE-300) INJECTION 61% COMPARISON:  Chest radiograph 11/09/2017 FINDINGS: Lower chest: Multi chamber cardiomegaly with primary right heart dilatation. Pacemaker partially included. Small bilateral pleural effusions and adjacent atelectasis. Hepatobiliary: The liver is enlarged with diffusely decreased hepatic density. Focal fatty sparing adjacent the gallbladder fossa. Mild periportal edema. Gallbladder is physiologically  distended with diffuse gallbladder wall thickening in small amount of pericholecystic fluid. No calcified stone. Pancreas: No ductal dilatation or inflammation. Spleen: Enlarged spanning 16.7 cm cranial caudal. Normal heterogeneity for phase of enhancement. Adrenals/Urinary Tract: Normal adrenal glands. No hydronephrosis or perinephric edema. Homogeneous renal enhancement. Small subcentimeter simple cysts in the left upper kidney. Urinary bladder is physiologically distended without wall thickening. No definite perivesicular stranding. Stomach/Bowel: Stomach distended with ingested contents and enteric contrast no small bowel dilatation, obstruction or inflammation. Normal appendix. Moderate stool in the proximal colon. Descending and sigmoid colon are minimally distended with equivocal wall thickening versus nondistention. Vascular/Lymphatic: Distended supra hepatic IVC. Normal caliber abdominal aorta. Prominent periuterine and adnexal vascularity. No enlarged abdominal or pelvic lymph nodes. Reproductive: Prominent periuterine and adnexal vascularity, left greater than right. Small cysts or follicles in the left ovary. Right ovary appears normal. Other: Small volume of simple free fluid in the abdomen and pelvis. No free air. No loculated abscess. Musculoskeletal: There are no acute or suspicious osseous abnormalities. IMPRESSION: 1. Diffuse gallbladder wall thickening is likely multifactorial but felt to be secondary to passive hepatic congestion or chronic liver disease. 2. Hepatosplenomegaly and hepatic steatosis. Suspect passive hepatic congestion with prominent right heart dilatation. 3. Left colonic wall thickening versus nondistention. 4. Prominent adnexal and periuterine vascularity as can be seen with pelvic congestion. 5. Cardiomegaly with predominant right heart dilatation. Small bilateral pleural effusions and small amount of ascites in the abdomen and pelvis. Electronically Signed   By: Rubye Oaks M.D.   On: 11/15/2017 06:21    Procedures Procedures (including critical care time)  Medications Ordered in ED Medications  cefTRIAXone (ROCEPHIN) 1 g in sodium chloride 0.9 % 100 mL IVPB (1 g Intravenous New Bag/Given 11/15/17 0644)  sodium chloride 0.9 % bolus 1,000 mL (0 mLs Intravenous Stopped 11/15/17 0227)  sodium chloride  0.9 % bolus 500 mL (0 mLs Intravenous Stopped 11/15/17 0227)  ondansetron (ZOFRAN) injection 4 mg (4 mg Intravenous Given 11/15/17 0125)  ketorolac (TORADOL) 30 MG/ML injection 30 mg (30 mg Intravenous Given 11/15/17 0125)  cefTRIAXone (ROCEPHIN) 1 g in sodium chloride 0.9 % 100 mL IVPB (0 g Intravenous Stopped 11/15/17 0407)  iopamidol (ISOVUE-300) 61 % injection 100 mL (100 mLs Intravenous Contrast Given 11/15/17 0555)  iopamidol (ISOVUE-300) 61 % injection 30 mL (30 mLs Oral Contrast Given 11/15/17 0603)     Initial Impression / Assessment and Plan / ED Course  I have reviewed the triage vital signs and the nursing notes.  Pertinent labs & imaging results that were available during my care of the patient were reviewed by me and considered in my medical decision making (see chart for details).      Patient was given IV fluids, IV nausea and IV Toradol for her pain.  2:40 AM I reviewed her laboratory testing and she appears to have a UTI.  I am wondering if maybe her pain is from pyelonephritis.  When I go back and examined the patient she does have some flank pain on the right.  She was given Rocephin 1 g IV.  3:30 AM after reviewing all of her test results, I talked to the patient.  At this point felt we needed to proceed to a CT of her abdomen and pelvis to try to clarify the etiology of her discomfort.  She is agreeable.  6:40 AM patient was informed of her CT results.  We also discussed the need for getting a ultrasound of her gallbladder and chest x-ray.  She is agreeable.  07:35 AM Pt turned over to Dr Juleen ChinaKohut at change of shift to get results of her  US and CXR.   [place] of [place] shows patient gets #21 Suboxone 8/2 mg sublingual films every 7 days from [facility] [place].  The most recent tramadol AC is number 240 tablets of tramadol 50 mg tablets filled on August 12, 2017.  Her last prescription for Vyvanse was #7 Vyvanse 30 mg capsules on April 10 and then prescription also on April 3 and March 27.  These were prescribed by the same person.  Final Clinical Impressions(s) / ED Diagnoses   Final diagnoses:  Right upper quadrant abdominal pain  RUQ pain  Acute cystitis without hematuria  Nausea  Diarrhea, unspecified type    Disposition pending  Devoria AlbeIva Johntay Doolen, MD, Concha PyoFACEP    Adarrius Graeff, MD 11/15/17 [phone]

## 2017-11-15 NOTE — ED Notes (Addendum)
Dr Juleen ChinaKohut in to speak with pt before meds given.  Pt has concerns and questions.  Pt is tearful.

## 2017-11-15 NOTE — Progress Notes (Signed)
Patient removed belly button piercing, placed in plastic baggie and returned with patient in their posession to ED.

## 2017-11-15 NOTE — Progress Notes (Signed)
[facility]  Saw patient. Full consult note to follow.  Patient with enlarged heart, large liver, concern for hepatic congestion and heart failure causing the fluid around the gallbladder. No stones in the gallbladder and does not fit the typical acalculous cholecystitis. Very unlikely to be cholecystitis based on the other issues going on.   HIDA will not likely add anything and will not change immediate management.  Needs further cardiac workup /ECHO, etc. Cardiologist is Dr[facility] Mayme [facility]ucker at [facility]. ED going to get in touch with them.   Algis GreenhouseLindsay Tymeer Vaquera, MD [facility] [address] [place], [place] [id number] [phone] (office)

## 2017-11-15 NOTE — ED Notes (Signed)
Patient states "I need my suboxone I think I'm going through withdrawals." EDP aware.

## 2017-11-15 NOTE — ED Notes (Signed)
Pt asleep.

## 2018-10-19 IMAGING — US US ABDOMEN LIMITED
1 series · 14 of 25 positions shown · non-contrast
Comparison: CT 11/15/2017.

CLINICAL DATA: RUQ pain, abn CT

EXAM:
ULTRASOUND ABDOMEN LIMITED RIGHT UPPER QUADRANT

[Series 1: us abdomen limited · 0.16mm/px · 14 of 64 slices shown]
[im 1/64]
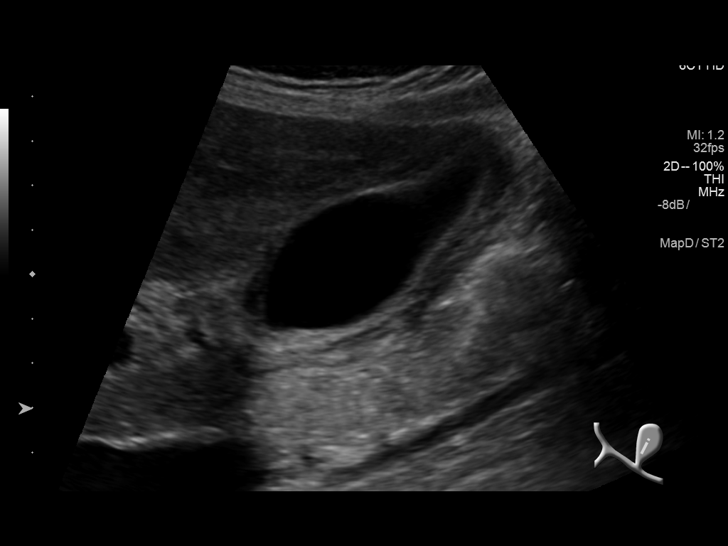
[im 6/64]
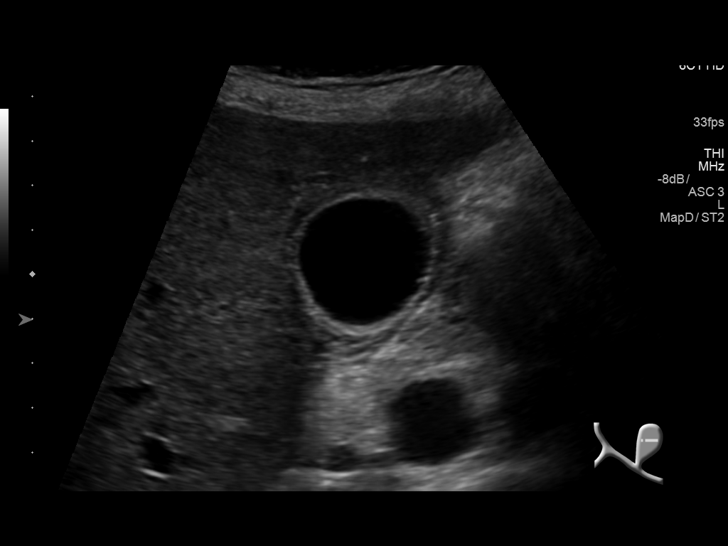
[im 11/64]
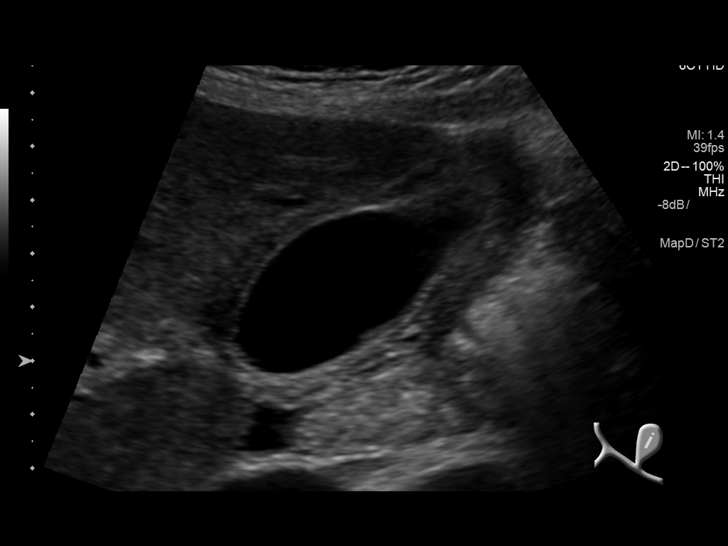
[im 16/64]
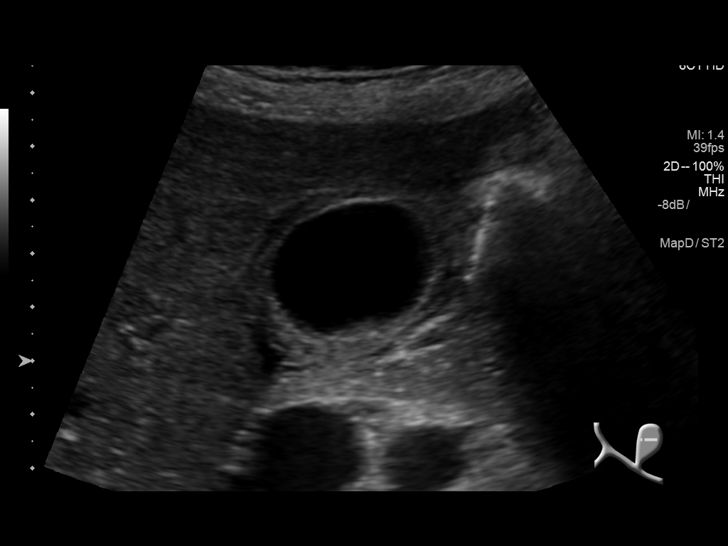
[im 22/64]
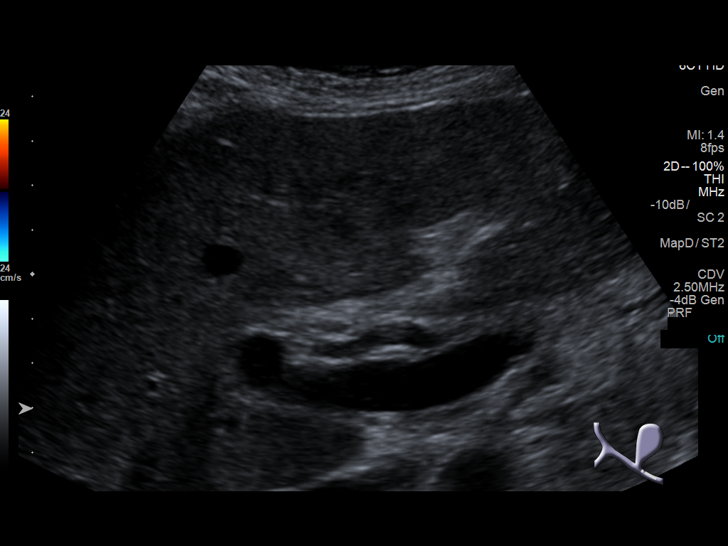
[im 24/64]
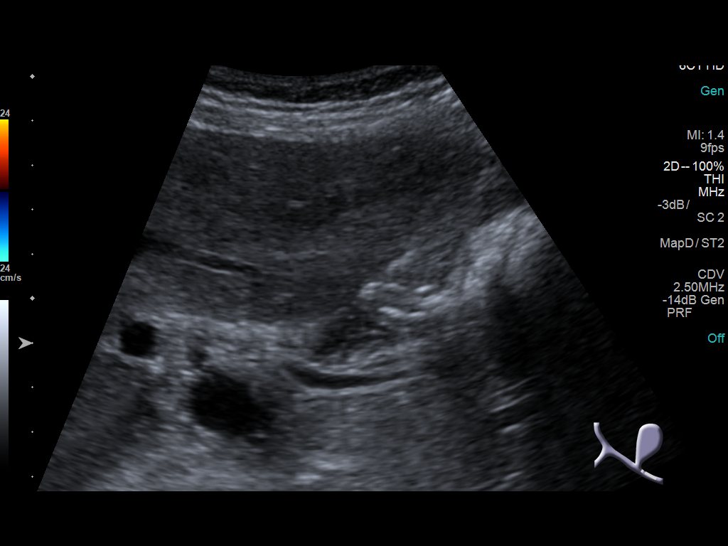
[im 29/64]
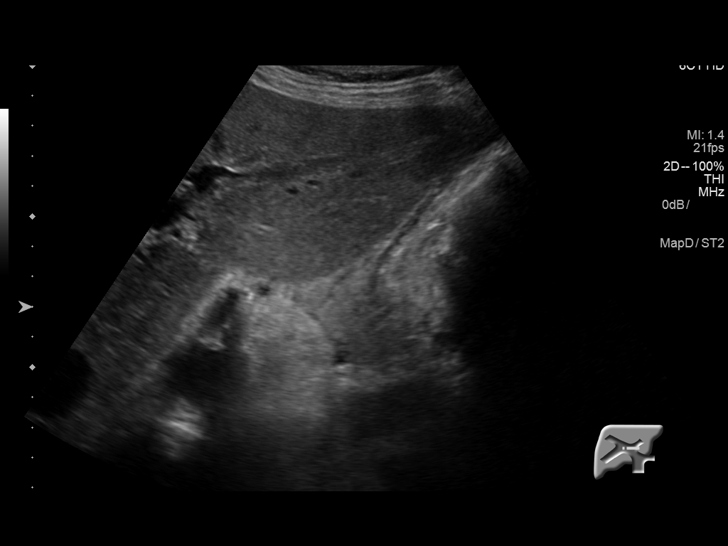
[im 35/64]
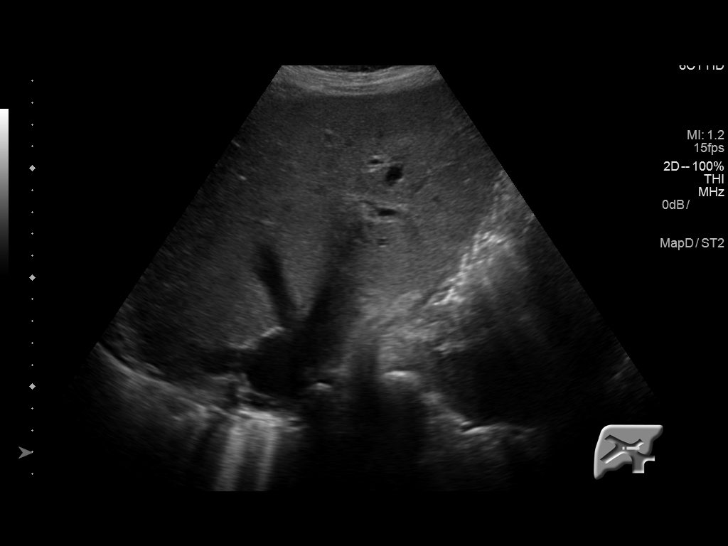
[im 40/64]
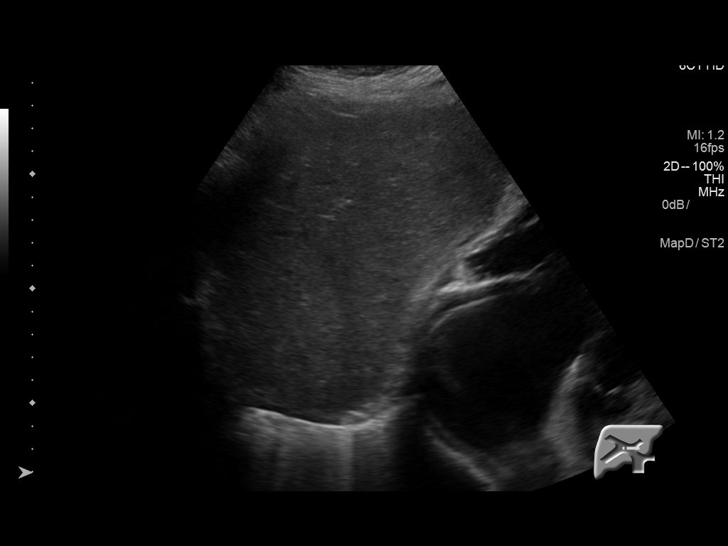
[im 43/64]
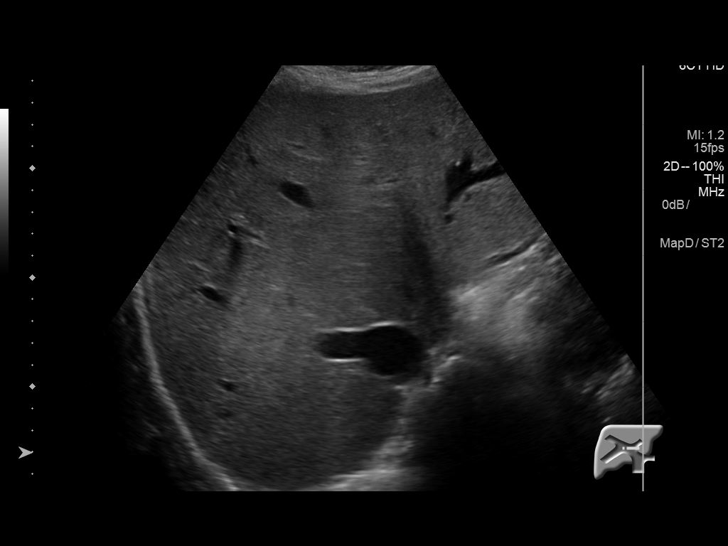
[im 48/64]
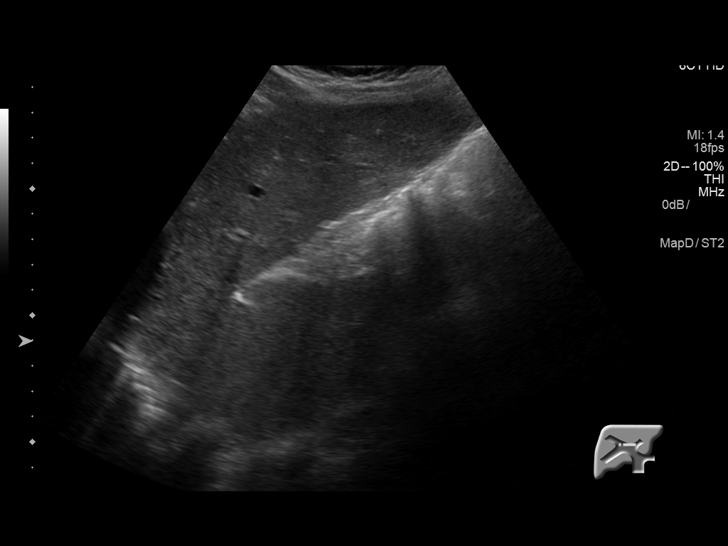
[im 53/64]
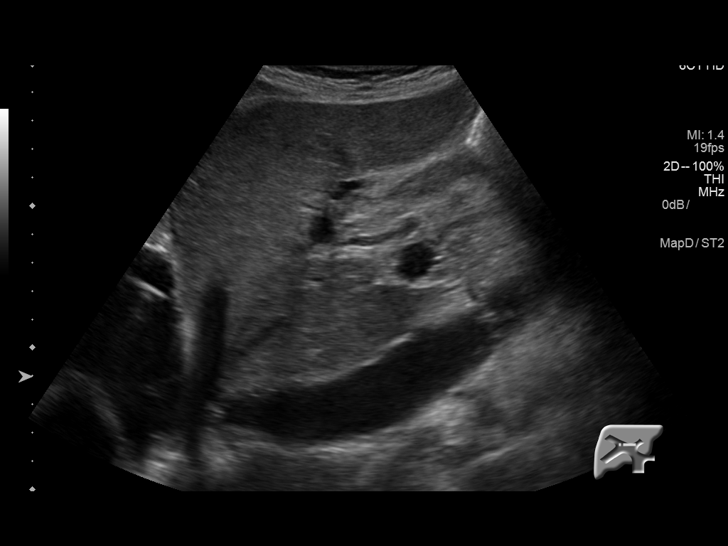
[im 58/64]
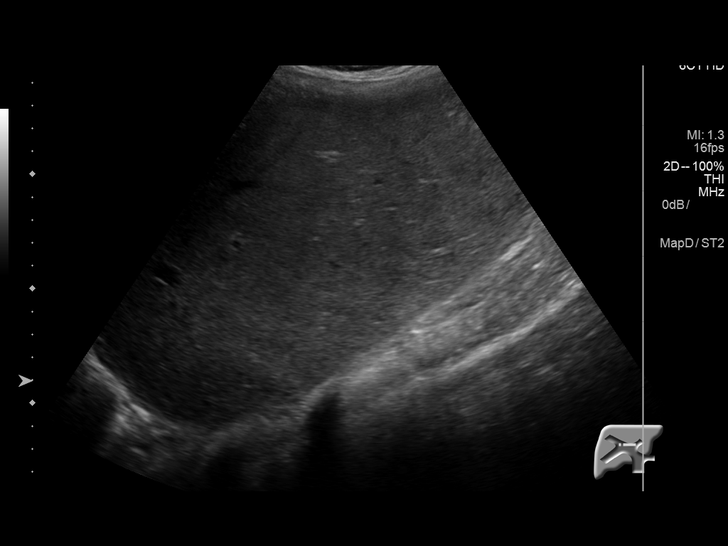
[im 64/64]
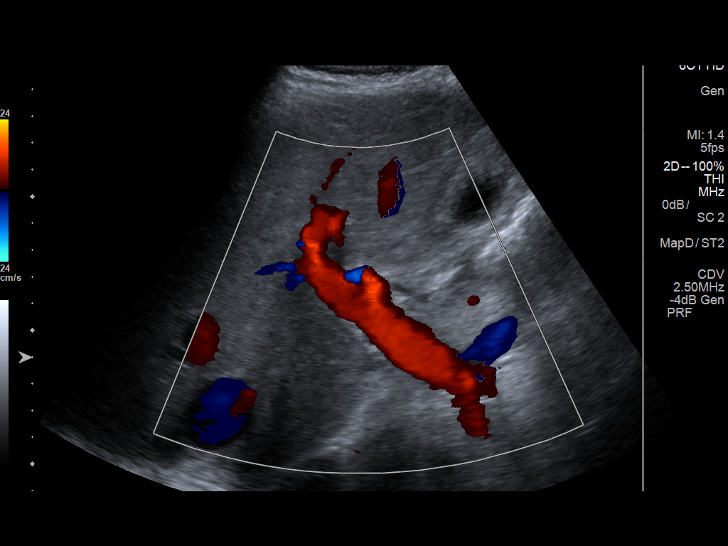

[14 of 25 positions shown; findings below may reference images not displayed]

FINDINGS: Gallbladder:

Gallbladder wall is thickened up 6.8 mm. No gallstones noted.
Negative Murphy sign.

Common bile duct:

Diameter: 3.4 mm

Liver:

No focal lesion identified. Within normal limits in parenchymal
echogenicity. Portal vein is patent on color Doppler imaging with
normal direction of blood flow towards the liver.
IMPRESSION: Thickening of the gallbladder wall up to 6.8 mm. This may be from
hypoproteinemia. Acalculous cholecystitis cannot be excluded. No
gallstones identified. No biliary distention.

## 2020-03-29 IMAGING — CT CT ABD-PELV W/ CM
2 of 4 series · 15 of 46 positions shown, 17 images · IV contrast (Isovue)
Comparison: Chest radiograph 11/09/2017

CLINICAL DATA: Abdominal pain. Right upper quadrant tenderness.
UTI. Heroin abuse.

EXAM:
CT ABDOMEN AND PELVIS WITH CONTRAST
TECHNIQUE: Multidetector CT imaging of the abdomen and pelvis was performed
using the standard protocol following bolus administration of
intravenous contrast.
CONTRAST:  100mL GH98MT-733 IOPAMIDOL (GH98MT-733) INJECTION 61%,
30mL GH98MT-[id number] IOPAMIDOL (GH98MT-733) INJECTION 61%

[Series 2: axial st · axial · 0.63mm/px · z∈[-332,+48]mm · 12 of 90 slices shown, 14 images]
[im 7/90  soft-tissue]
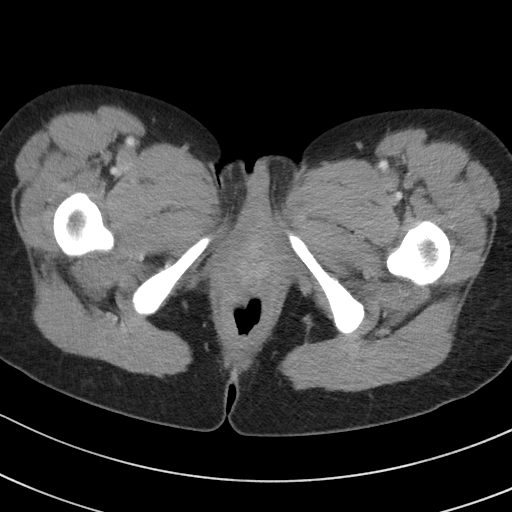
[im 7/90  bone]
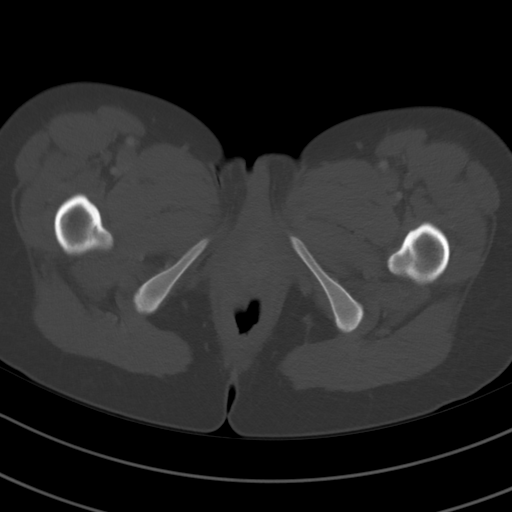
[im 14/90  soft-tissue]
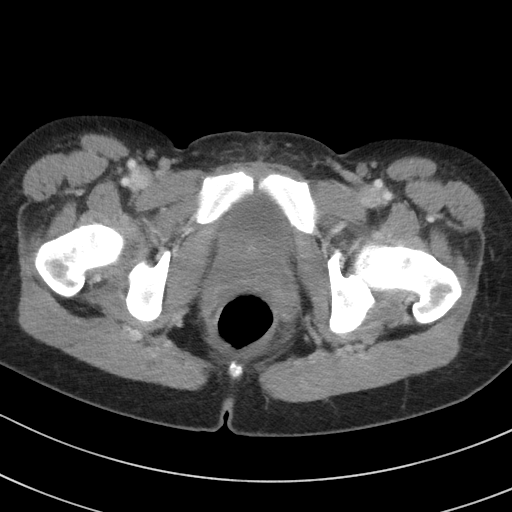
[im 21/90  soft-tissue]
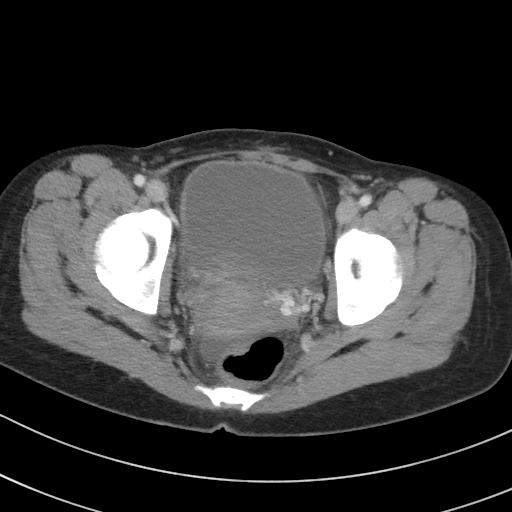
[im 28/90  soft-tissue]
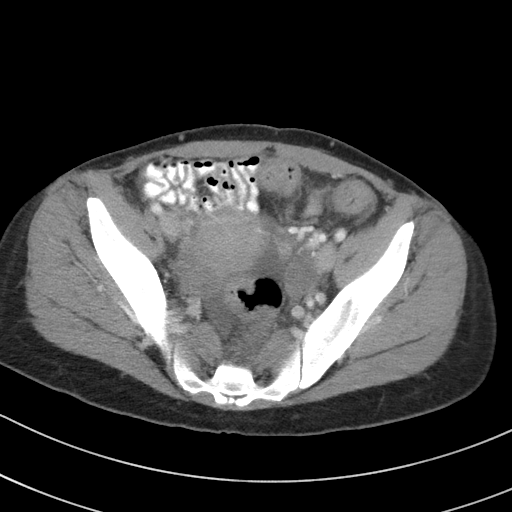
[im 35/90  soft-tissue]
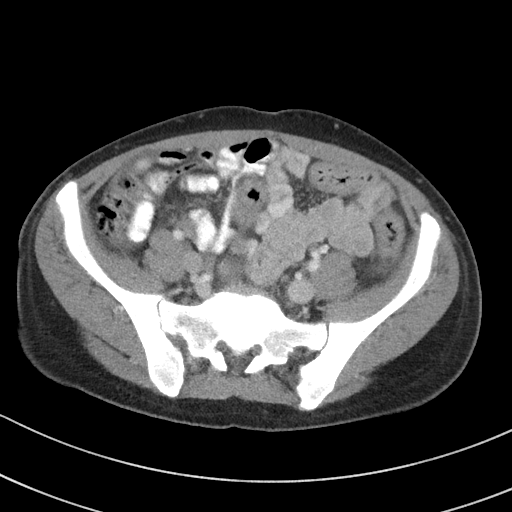
[im 42/90  soft-tissue]
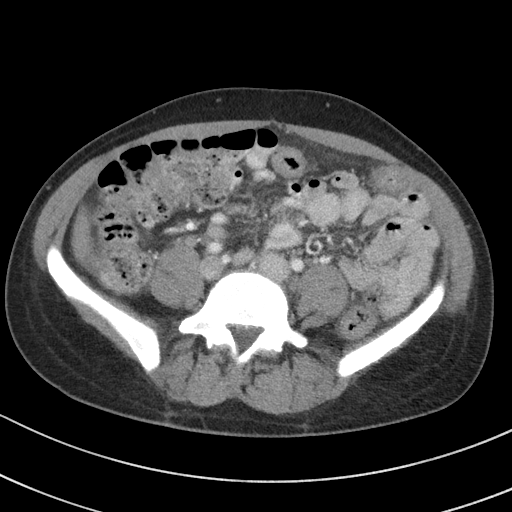
[im 48/90  soft-tissue]
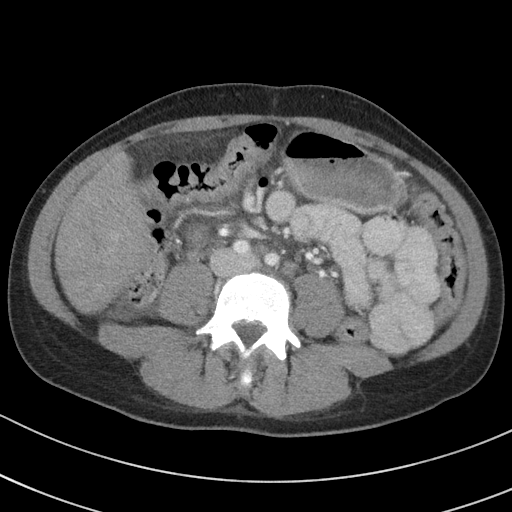
[im 55/90  soft-tissue]
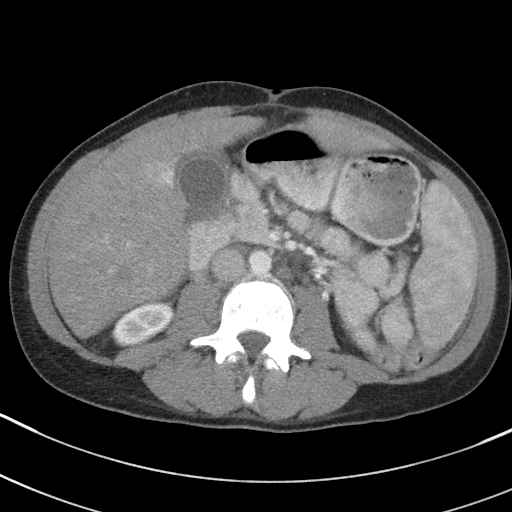
[im 62/90  soft-tissue]
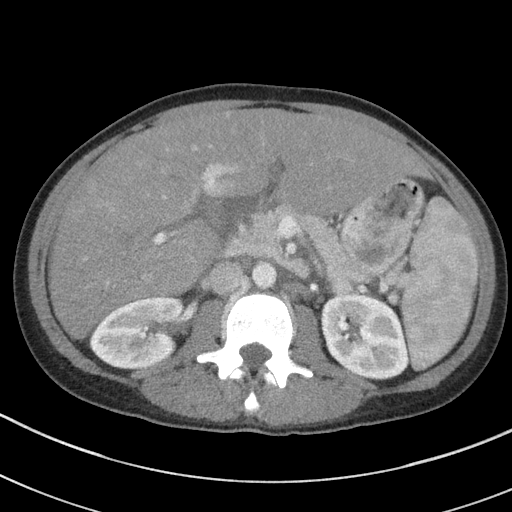
[im 62/90  bone]
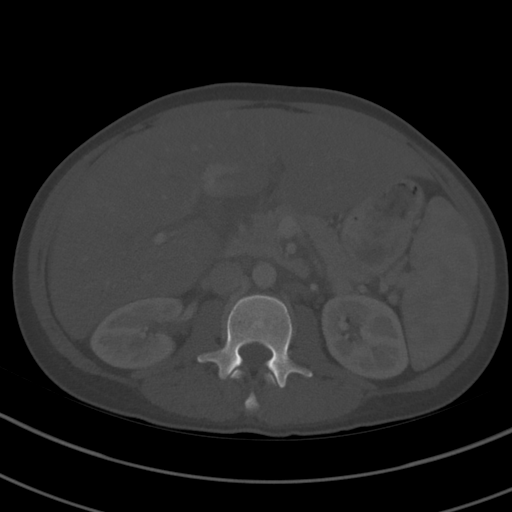
[im 69/90  soft-tissue]
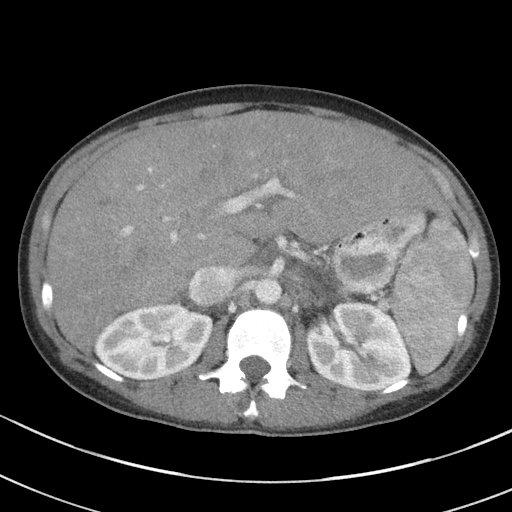
[im 76/90  soft-tissue]
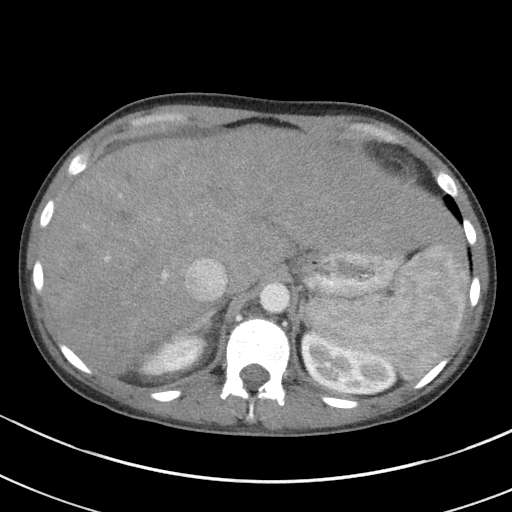
[im 83/90  soft-tissue]
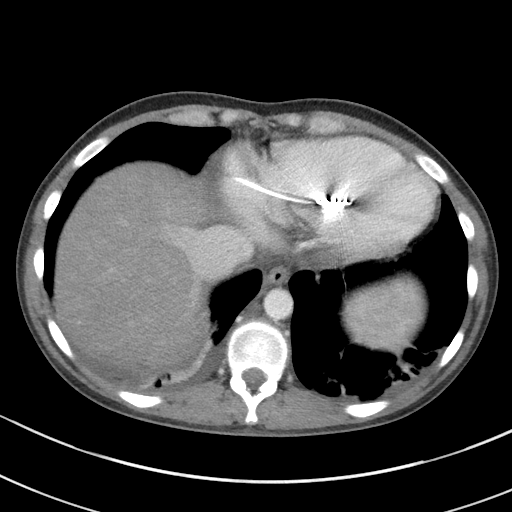

[Series 5: coronal st · coronal · 0.67mm/px · 3 of 74 slices shown]
[im 25/74  soft-tissue]
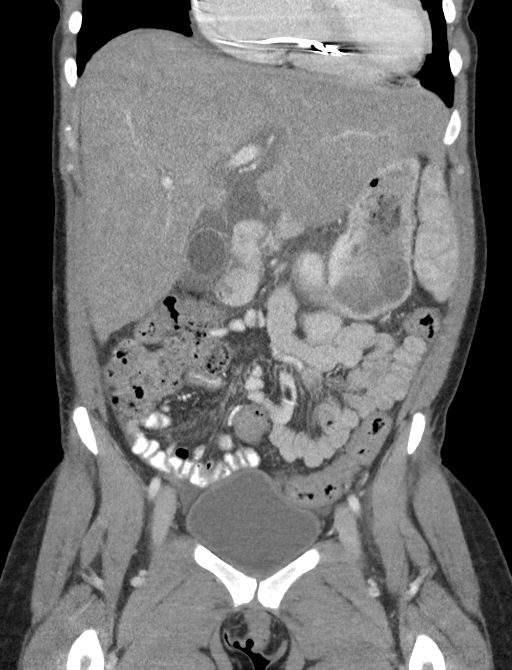
[im 33/74  soft-tissue]
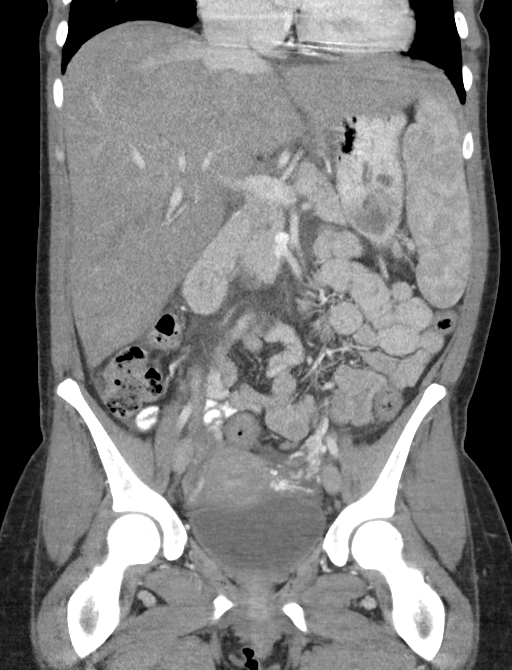
[im 41/74  soft-tissue]
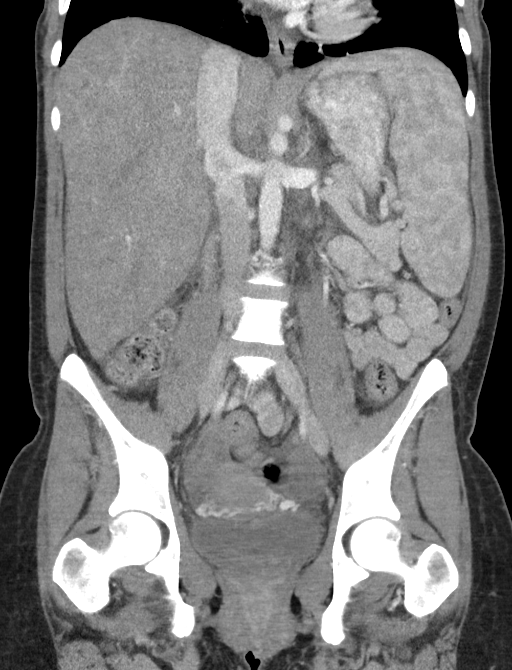

[15 of 46 positions shown; findings below may reference images not displayed]

FINDINGS: Lower chest: Multi chamber cardiomegaly with primary right heart
dilatation. Pacemaker partially included. Small bilateral pleural
effusions and adjacent atelectasis.

Hepatobiliary: The liver is enlarged with diffusely decreased
hepatic density. Focal fatty sparing adjacent the gallbladder fossa.
Mild periportal edema. Gallbladder is physiologically distended with
diffuse gallbladder wall thickening in small amount of
pericholecystic fluid. No calcified stone.

Pancreas: No ductal dilatation or inflammation.

Spleen: Enlarged spanning 16.7 cm cranial caudal. Normal
heterogeneity for phase of enhancement.

Adrenals/Urinary Tract: Normal adrenal glands. No hydronephrosis or
perinephric edema. Homogeneous renal enhancement. Small
subcentimeter simple cysts in the left upper kidney. Urinary bladder
is physiologically distended without wall thickening. No definite
perivesicular stranding.

Stomach/Bowel: Stomach distended with ingested contents and enteric
contrast no small bowel dilatation, obstruction or inflammation.
Normal appendix. Moderate stool in the proximal colon. Descending
and sigmoid colon are minimally distended with equivocal wall
thickening versus nondistention..

Vascular/Lymphatic: Distended supra hepatic IVC. Normal caliber
abdominal aorta. Prominent periuterine and adnexal vascularity. No
enlarged abdominal or pelvic lymph nodes.

Reproductive: Prominent periuterine and adnexal vascularity, left
greater than right. Small cysts or follicles in the left ovary.
Right ovary appears normal.

Other: Small volume of simple free fluid in the abdomen and pelvis.
No free air. No loculated abscess.

Musculoskeletal: There are no acute or suspicious osseous
abnormalities.
IMPRESSION: 1. Diffuse gallbladder wall thickening is likely multifactorial but
felt to be secondary to passive hepatic congestion or chronic liver
disease.
2. Hepatosplenomegaly and hepatic steatosis. Suspect passive hepatic
congestion with prominent right heart dilatation.
3. Left colonic wall thickening versus nondistention.
4. Prominent adnexal and periuterine vascularity as can be seen with
pelvic congestion.
5. Cardiomegaly with predominant right heart dilatation. Small
bilateral pleural effusions and small amount of ascites in the
abdomen and pelvis.

## 2020-03-29 IMAGING — DX DG CHEST 2V
2 series · 2 of 2 positions shown · non-contrast
Comparison: 11/09/2017

CLINICAL DATA: Cough

EXAM:
CHEST - 2 VIEW

[chest pa]
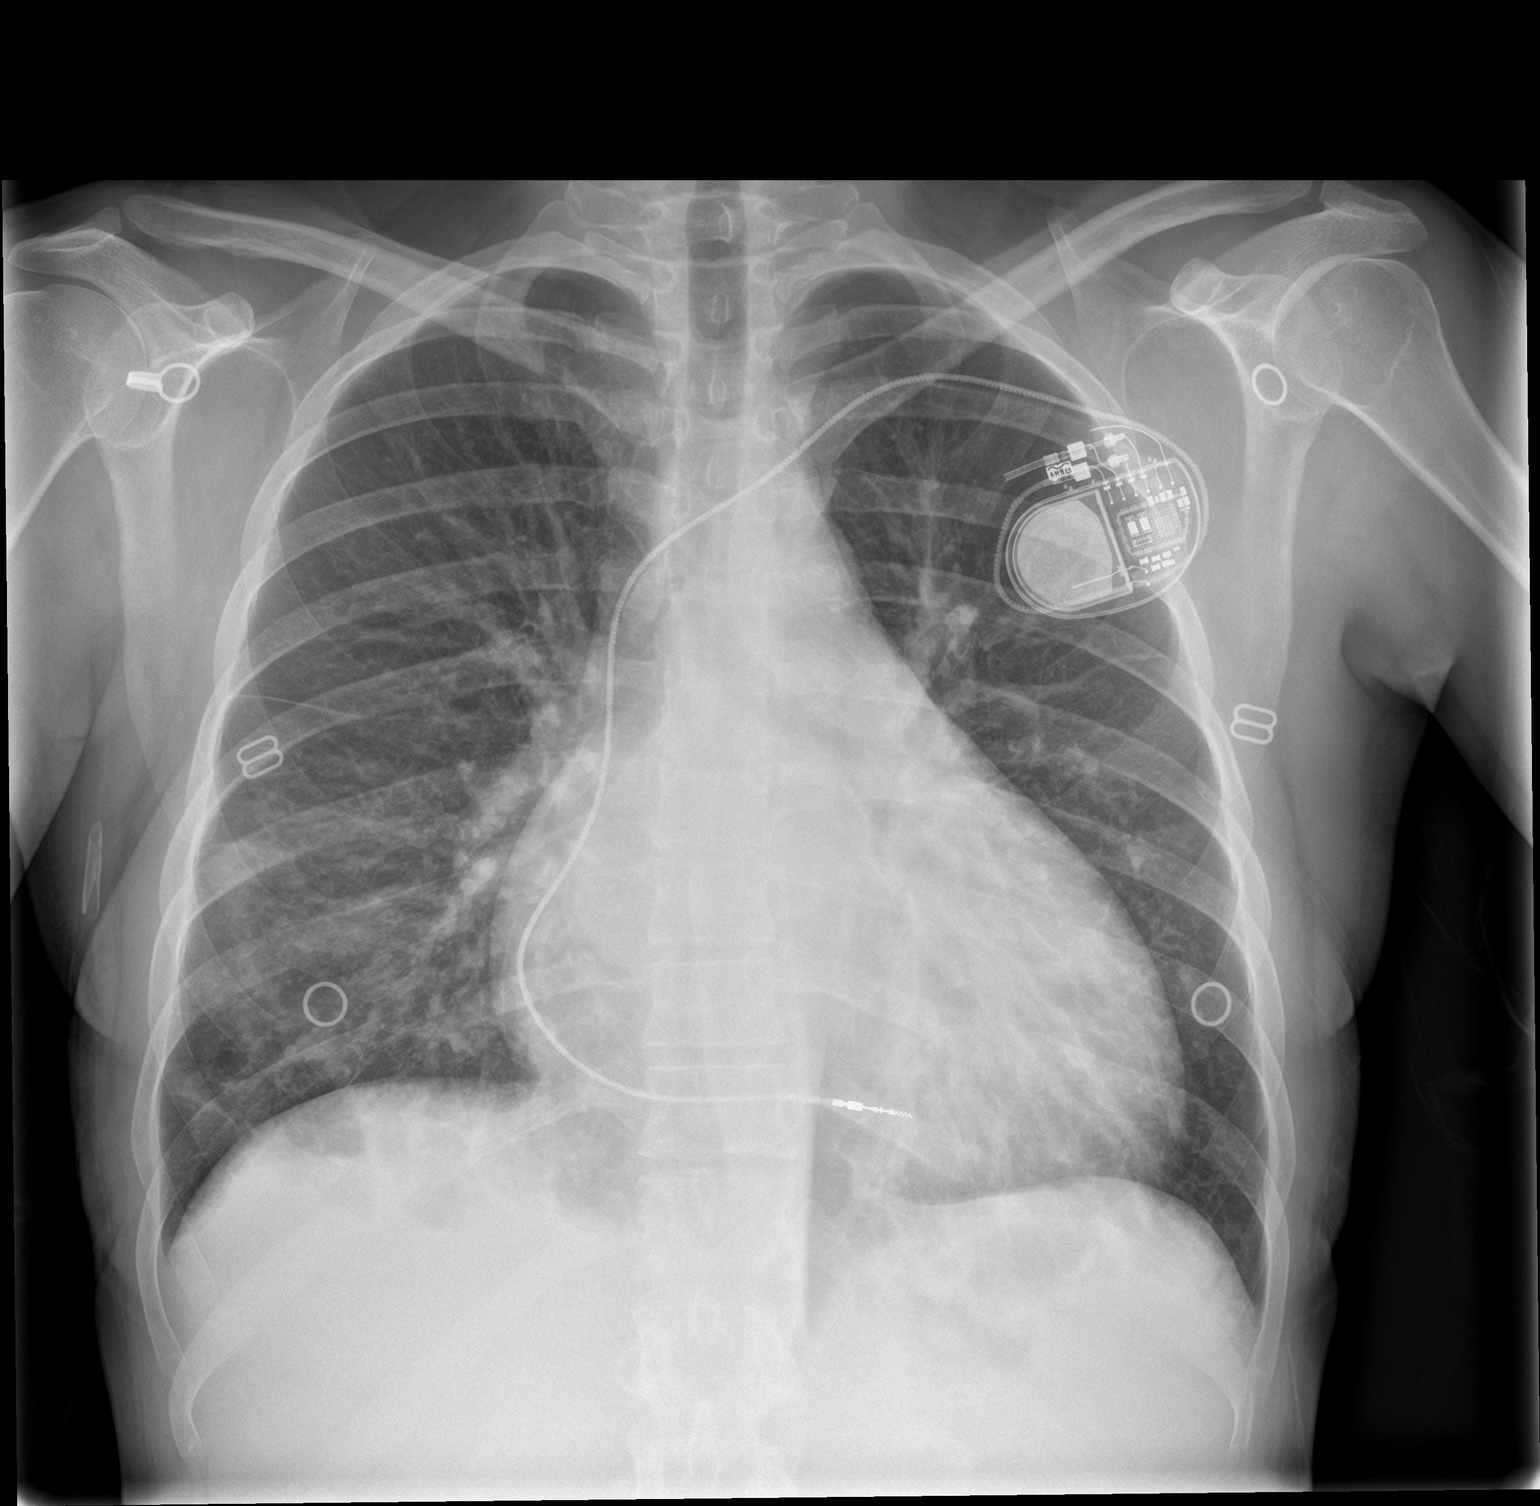

[chest lat]
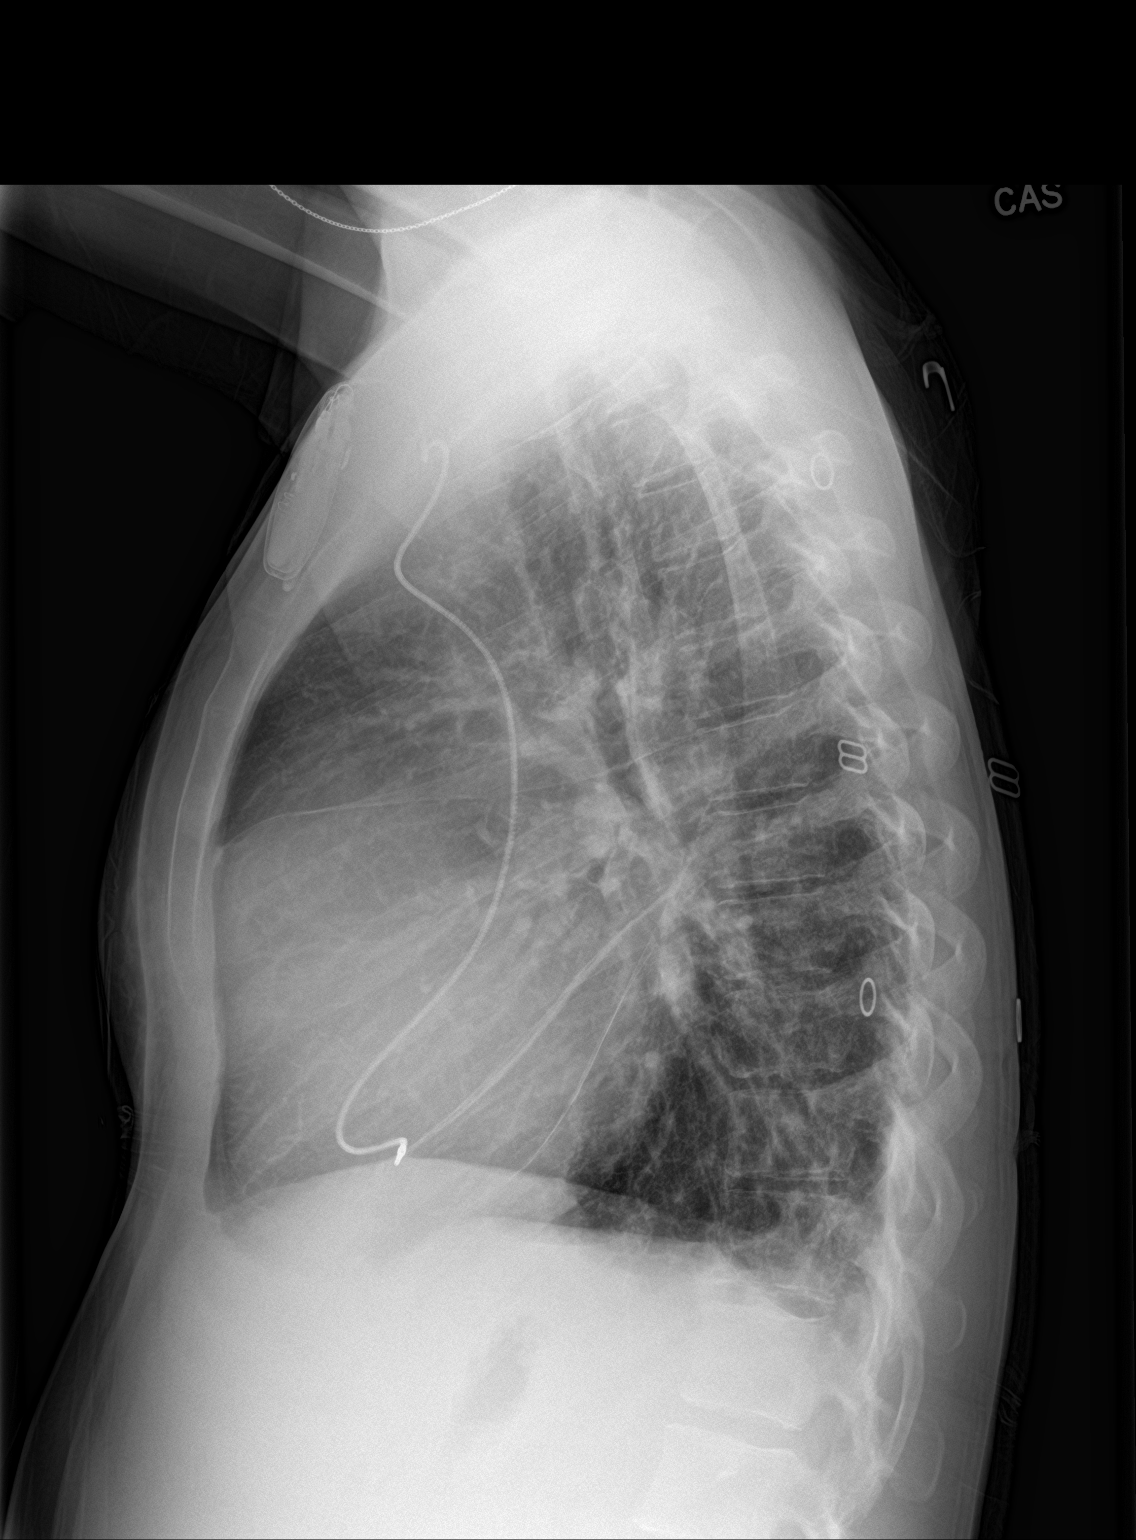

[2 of 2 positions shown; findings below may reference images not displayed]

FINDINGS: Left pacer remains in place, unchanged. Cardiomegaly. Patchy
bilateral lower lobe airspace opacities. No effusions or
pneumothorax. No acute bony abnormality.
IMPRESSION: Stable cardiomegaly.

Patchy bilateral lower lobe airspace opacities concerning for
pneumonia.

## 2021-12-03 DEATH — deceased
